# Patient Record
Sex: Male | Born: 1985
Health system: Southern US, Community
[De-identification: ages and names within clinical notes are randomized; demographics above are authoritative.]

## PROBLEM LIST (undated history)

## (undated) DIAGNOSIS — K122 Cellulitis and abscess of mouth: Secondary | ICD-10-CM

## (undated) HISTORY — PX: HAND SURGERY: SHX662

---

## 2006-12-16 ENCOUNTER — Other Ambulatory Visit: Payer: Self-pay

## 2006-12-16 ENCOUNTER — Ambulatory Visit: Payer: Self-pay | Admitting: Orthopaedic Surgery

## 2016-10-15 ENCOUNTER — Emergency Department (HOSPITAL_BASED_OUTPATIENT_CLINIC_OR_DEPARTMENT_OTHER)
Admission: EM | Admit: 2016-10-15 | Discharge: 2016-10-15 | Disposition: A | Discharge status: Home / Self Care | Payer: Self-pay | Attending: Emergency Medicine | Admitting: Emergency Medicine

## 2016-10-15 ENCOUNTER — Encounter (HOSPITAL_BASED_OUTPATIENT_CLINIC_OR_DEPARTMENT_OTHER): Payer: Self-pay | Admitting: *Deleted

## 2016-10-15 DIAGNOSIS — F17210 Nicotine dependence, cigarettes, uncomplicated: Secondary | ICD-10-CM | POA: Insufficient documentation

## 2016-10-15 DIAGNOSIS — K1379 Other lesions of oral mucosa: Secondary | ICD-10-CM | POA: Insufficient documentation

## 2016-10-15 LAB — RAPID STREP SCREEN (MED CTR MEBANE ONLY): Streptococcus, Group A Screen (Direct): NEGATIVE

## 2016-10-15 MED ORDER — METHYLPREDNISOLONE SODIUM SUCC 125 MG IJ SOLR
125.0000 mg | Freq: Once | INTRAMUSCULAR | Status: AC
  Administered 2016-10-15: 125 mg via INTRAVENOUS
  Filled 2016-10-15: qty 2

## 2016-10-15 MED ORDER — PREDNISONE 20 MG PO TABS: 60.0000 mg | ORAL_TABLET | Freq: Every day | ORAL | 0 refills | Status: DC

## 2016-10-15 MED ORDER — DIPHENHYDRAMINE HCL 50 MG/ML IJ SOLN
25.0000 mg | Freq: Once | INTRAMUSCULAR | Status: AC
  Administered 2016-10-15: 25 mg via INTRAVENOUS
  Filled 2016-10-15: qty 1

## 2016-10-15 MED FILL — predniSONE 20 MG TABS: 20 | 5 days supply | Qty: 15 | Fill #0

## 2016-10-15 NOTE — ED Notes (Signed)
Pt directed to pharmacy to pick up RX. Pt's wife present to drive

## 2016-10-15 NOTE — ED Provider Notes (Signed)
Please see previous physicians note regarding patient's presenting history and physical, initial ED course, and associated medical decision making.  Patient presents to ED with uvula swelling. On my re-evaluation, he is comfortably sleeping. Upon awakening, he reports swelling has gone down with steroids and benadryl. He is comfortable with discharge and outpatient treatments. He will continue benadryl and steroids as outpatient. Strict return and follow-up instructions reviewed. He expressed understanding of all discharge instructions and felt comfortable with the plan of care.    Lavera Guise, MD 10/15/16 [phone]

## 2016-10-15 NOTE — Discharge Instructions (Signed)
Take loratadine (Claritin) or cetirizine (Zyrtec) once a day. Take diphenhydramine (Benadryl) every four hours as needed. Return if the swelling feels like it is getting worse.

## 2016-10-15 NOTE — ED Notes (Signed)
Pt states did have cough yesterday  Pt having no diff talking

## 2016-10-15 NOTE — ED Triage Notes (Signed)
Pt states woke this am felt like his throat was swelling and having diff swallowing

## 2016-10-15 NOTE — ED Provider Notes (Signed)
[facility] Provider [facility]   CSN: [id number] Arrival date & time: 10/15/16  0524     History   Chief Complaint Chief Complaint  Patient presents with  . Oral Swelling    HPI George Huerta is a 31 y.o. male.  The history is provided by the patient.  He woke up this morning with a sense that something was in his throat was making it difficult for him to breathe and swallow. His wife looked in his throat and noticed that his uvula was swollen. He is not on any medication other than ranitidine. He denies any new exposures. Denies any rash. Symptoms have been stable since onset. He felt fine yesterday. This has never happened before. Nothing makes it better, nothing makes it worse.  History reviewed. No pertinent past medical history.  There are no active problems to display for this patient.   History reviewed. No pertinent surgical history.     Home Medications    Prior to Admission medications   Not on File    Family History History reviewed. No pertinent family history.  Social History Social History  Substance Use Topics  . Smoking status: Current Every Day Smoker  . Smokeless tobacco: Never Used  . Alcohol use No     Allergies   Patient has no known allergies.   Review of Systems Review of Systems  All other systems reviewed and are negative.    Physical Exam Updated Vital Signs BP 133/90 (BP Location: Right Arm)   Pulse 73   Temp 97.7 F (36.5 C) (Oral)   Resp 18   Ht 6' (1.829 m)   Wt 99.8 kg (220 lb)   SpO2 100%   BMI 29.84 kg/m   Physical Exam  Nursing note and vitals reviewed.  31 year old male, resting comfortably and in no acute distress. Vital signs are normal. Oxygen saturation is 100%, which is normal. Head is normocephalic and atraumatic. PERRLA, EOMI. there is mild edema of the uvula. There is no erythema and no exudate. No pooling of secretions. Phonation is normal. Neck is nontender and supple without  adenopathy or JVD. Back is nontender and there is no CVA tenderness. Lungs are clear without rales, wheezes, or rhonchi. Chest is nontender. Heart has regular rate and rhythm without murmur. Abdomen is soft, flat, nontender without masses or hepatosplenomegaly and peristalsis is normoactive. Extremities have no cyanosis or edema, full range of motion is present. Skin is warm and dry without rash. Neurologic: Mental status is normal, cranial nerves are intact, there are no motor or sensory deficits.  ED Treatments / Results  Labs (all labs ordered are listed, but only abnormal results are displayed) Labs Reviewed  RAPID STREP SCREEN (NOT AT [facility])  CULTURE, GROUP A STREP [facility])   Procedures Procedures (including critical care time)  Medications Ordered in ED Medications  diphenhydrAMINE (BENADRYL) injection 25 mg (25 mg Intravenous Given 10/15/16 0603)  methylPREDNISolone sodium succinate (SOLU-MEDROL) 125 mg/2 mL injection 125 mg (125 mg Intravenous Given 10/15/16 0603)     Initial Impression / Assessment and Plan / ED Course  I have reviewed the triage vital signs and the nursing notes.  Pertinent labs & imaging results that were available during my care of the patient were reviewed by me and considered in my medical decision making (see chart for details).  Swelling of uvula of uncertain cause. Probably allergic. He is given diphenhydramine and methylprednisolone and will be observed. Old records are reviewed, and he has  no relevant past visits.  7:06 AM Following above-noted treatment, he feels much better. However, it is only been about one hour since medication ministration. He will be observed in the ED for at least another hour. Anticipate discharge with prescription for prednisone and advised to take over-the-counter second-generation antihistamines.  Final Clinical Impressions(s) / ED Diagnoses   Final diagnoses:  Uvular swelling    New Prescriptions New  Prescriptions   PREDNISONE (DELTASONE) 20 MG TABLET    Take 3 tablets (60 mg total) by mouth daily.     Dione Booze, MD 10/15/16 [phone]

## 2016-10-17 LAB — CULTURE, GROUP A STREP (THRC)

## 2016-12-09 ENCOUNTER — Other Ambulatory Visit: Payer: Self-pay

## 2016-12-09 ENCOUNTER — Emergency Department (HOSPITAL_BASED_OUTPATIENT_CLINIC_OR_DEPARTMENT_OTHER)
Admission: EM | Admit: 2016-12-09 | Discharge: 2016-12-09 | Disposition: A | Discharge status: Home / Self Care | Payer: Self-pay | Attending: Emergency Medicine | Admitting: Emergency Medicine

## 2016-12-09 ENCOUNTER — Encounter (HOSPITAL_BASED_OUTPATIENT_CLINIC_OR_DEPARTMENT_OTHER): Payer: Self-pay

## 2016-12-09 DIAGNOSIS — F17210 Nicotine dependence, cigarettes, uncomplicated: Secondary | ICD-10-CM | POA: Insufficient documentation

## 2016-12-09 DIAGNOSIS — K122 Cellulitis and abscess of mouth: Secondary | ICD-10-CM | POA: Insufficient documentation

## 2016-12-09 MED ORDER — DIPHENHYDRAMINE HCL 50 MG/ML IJ SOLN
50.0000 mg | Freq: Once | INTRAMUSCULAR | Status: AC
  Administered 2016-12-09: 50 mg via INTRAVENOUS
  Filled 2016-12-09: qty 1

## 2016-12-09 MED ORDER — PREDNISONE 20 MG PO TABS: 60.0000 mg | ORAL_TABLET | Freq: Every day | ORAL | 0 refills | Status: DC

## 2016-12-09 MED ORDER — METHYLPREDNISOLONE SODIUM SUCC 125 MG IJ SOLR
125.0000 mg | Freq: Once | INTRAMUSCULAR | Status: AC
  Administered 2016-12-09: 125 mg via INTRAVENOUS
  Filled 2016-12-09: qty 2

## 2016-12-09 NOTE — ED Notes (Signed)
Pt sleeping with resp easy and reg, arouses to spoken name, informed that his wife is out front to pick him up and drive him home.

## 2016-12-09 NOTE — ED Notes (Signed)
ED Provider at bedside. 

## 2016-12-09 NOTE — ED Triage Notes (Addendum)
Pt c/o uvula swelling and difficulty breathing. Pt seen for same last month, reports the steroids helped. Pt ambulated to room with steady gate and in NAD. Pt able to speak in complete sentences and no difficulty controlling secretions.

## 2016-12-09 NOTE — Discharge Instructions (Addendum)
You may continue Benadryl 50 mg every 8 hours for the next several days.  You may start your steroids tomorrow morning November 30.   To find a primary care or specialty doctor please call [phone] or [phone] to access "[facility] Find a Doctor Service."  You may also go on the [facility] website at [email or web address]  There are also multiple [facility], [facility], [facility] and [facility] practices throughout [place] that are frequently accepting new patients. You may find a clinic that is close to your home and contact them.  [facility] -  [address] [place] [id number] [phone]   [facility] -  [address] [place] [id number] [phone]   [facility] [phone]  [place] [phone]

## 2016-12-09 NOTE — ED Provider Notes (Signed)
TIME SEEN: 5:39 AM  CHIEF COMPLAINT: difficulty breathing and swallowing  HPI: Patient is a 31 year old male with no significant past medical history who presents to the emergency department with feeling like his uvula was swollen.  States he went to bed last night and was feeling fine and woke up this morning to go to work and felt like he could not swallow and breathe normally.  He has had uvulitis once before approximately 2 months ago.  Was given IV Benadryl and IV steroids and symptoms improved significantly.  Denies any lip or tongue swelling.  Denies any recent fevers, cough, sore throat.  Denies any new exposures.  No rash.  ROS: See HPI Constitutional: no fever  Eyes: no drainage  ENT: no runny nose   Cardiovascular:  no chest pain  Resp: no SOB  GI: no vomiting GU: no dysuria Integumentary: no rash  Allergy: no hives  Musculoskeletal: no leg swelling  Neurological: no slurred speech ROS otherwise negative  PAST MEDICAL HISTORY/PAST SURGICAL HISTORY:  No past medical history on file.  MEDICATIONS:  Prior to Admission medications   Medication Sig Start Date End Date Taking? Authorizing Provider  predniSONE (DELTASONE) 20 MG tablet Take 3 tablets (60 mg total) by mouth daily. 10/15/16  Yes Dione BoozeGlick, David, MD    ALLERGIES:  No Known Allergies  SOCIAL HISTORY:  Social History   Tobacco Use  . Smoking status: Current Every Day Smoker  . Smokeless tobacco: Never Used  Substance Use Topics  . Alcohol use: No    FAMILY HISTORY: No family history on file.  EXAM: BP 130/76 (BP Location: Right Arm)   Pulse 80   Temp 98.4 F (36.9 C) (Oral)   Resp 20   Ht 6' (1.829 m)   Wt 95.7 kg (211 lb)   SpO2 100%   BMI 28.62 kg/m  CONSTITUTIONAL: Alert and oriented and responds appropriately to questions. Well-appearing; well-nourished HEAD: Normocephalic EYES: Conjunctivae clear, pupils appear equal, EOMI ENT: normal nose; moist mucous membranes; No pharyngeal erythema or  petechiae, no tonsillar hypertrophy or exudate, no uvular deviation, uvula is mildly swollen but not erythematous and no associated lesions, no unilateral swelling, no trismus or drooling, no muffled voice, normal phonation, no stridor, no dental caries present, no drainable dental abscess noted, no Ludwig's angina, tongue sits flat in the bottom of the mouth, no angioedema, no facial erythema or warmth, no facial swelling; no pain with movement of the neck. NECK: Supple, no meningismus, no nuchal rigidity, no LAD  CARD: RRR; S1 and S2 appreciated; no murmurs, no clicks, no rubs, no gallops RESP: Normal chest excursion without splinting or tachypnea; breath sounds clear and equal bilaterally; no wheezes, no rhonchi, no rales, no hypoxia or respiratory distress, speaking full sentences ABD/GI: Normal bowel sounds; non-distended; soft, non-tender, no rebound, no guarding, no peritoneal signs, no hepatosplenomegaly BACK:  The back appears normal and is non-tender to palpation, there is no CVA tenderness EXT: Normal ROM in all joints; non-tender to palpation; no edema; normal capillary refill; no cyanosis, no calf tenderness or swelling    SKIN: Normal color for age and race; warm; no rash NEURO: Moves all extremities equally PSYCH: The patient's mood and manner are appropriate. Grooming and personal hygiene are appropriate.  MEDICAL DECISION MAKING: Patient here with a uvulitis.  Unclear etiology.  This is his second time with similar symptoms.  Reports IV steroids and Benadryl helped him significantly before.  Will give IV Benadryl and Solu-Medrol here today and monitor  patient.  He has no distress.  No trismus or drooling.  Normal phonation.  No stridor.  No other sign of allergic reaction.  No angioedema present.  His wife will be able to take him home.  ED PROGRESS: 7:00 AM  Pt's symptoms have improved.  He is resting comfortably.  No distress.  His wife will come pick him up.  Will discharge with  steroid prescription and have him continue Benadryl at home.  Patient comfortable with this plan.  Have given him allergy follow-up as this may be more allergic reaction rather than infectious.   At this time, I do not feel there is any life-threatening condition present. I have reviewed and discussed all results (EKG, imaging, lab, urine as appropriate) and exam findings with patient/family. I have reviewed nursing notes and appropriate previous records.  I feel the patient is safe to be discharged home without further emergent workup and can continue workup as an outpatient as needed. Discussed usual and customary return precautions. Patient/family verbalize understanding and are comfortable with this plan.  Outpatient follow-up has been provided if needed. All questions have been answered.      Usher Hedberg, Layla MawKristen N, DO 12/09/16 [phone]

## 2017-03-31 ENCOUNTER — Encounter (HOSPITAL_BASED_OUTPATIENT_CLINIC_OR_DEPARTMENT_OTHER): Payer: Self-pay | Admitting: Emergency Medicine

## 2017-03-31 ENCOUNTER — Emergency Department (HOSPITAL_BASED_OUTPATIENT_CLINIC_OR_DEPARTMENT_OTHER)
Admission: EM | Admit: 2017-03-31 | Discharge: 2017-03-31 | Disposition: A | Discharge status: Home / Self Care | Payer: Self-pay | Attending: Emergency Medicine | Admitting: Emergency Medicine

## 2017-03-31 ENCOUNTER — Other Ambulatory Visit: Payer: Self-pay

## 2017-03-31 DIAGNOSIS — K1379 Other lesions of oral mucosa: Secondary | ICD-10-CM | POA: Insufficient documentation

## 2017-03-31 DIAGNOSIS — F17210 Nicotine dependence, cigarettes, uncomplicated: Secondary | ICD-10-CM | POA: Insufficient documentation

## 2017-03-31 HISTORY — DX: Cellulitis and abscess of mouth: K12.2

## 2017-03-31 MED ORDER — FAMOTIDINE 20 MG PO TABS
40.0000 mg | ORAL_TABLET | Freq: Once | ORAL | Status: AC
  Administered 2017-03-31: 40 mg via ORAL
  Filled 2017-03-31: qty 2

## 2017-03-31 MED ORDER — DIPHENHYDRAMINE HCL 25 MG PO CAPS
50.0000 mg | ORAL_CAPSULE | Freq: Once | ORAL | Status: AC
  Administered 2017-03-31: 50 mg via ORAL
  Filled 2017-03-31: qty 2

## 2017-03-31 MED ORDER — METHYLPREDNISOLONE SODIUM SUCC 125 MG IJ SOLR
125.0000 mg | Freq: Once | INTRAMUSCULAR | Status: AC
  Administered 2017-03-31: 125 mg via INTRAMUSCULAR
  Filled 2017-03-31: qty 2

## 2017-03-31 NOTE — ED Provider Notes (Signed)
   [facility] Provider Note: Lowella DellJ. Lane Brittane Grudzinski, MD, FACEP  CSN: [id number] MRN: [id number] ARRIVAL: 03/31/17 at 0452 ROOM: MH05/MH05   CHIEF COMPLAINT  Oral Swelling   HISTORY OF PRESENT ILLNESS  03/31/17 5:02 AM George Huerta is a 32 y.o. male with a history of uvular edema in the past.  These episodes usually resolve readily after Solu-Medrol and Benadryl.  He awoke this morning with swelling in his throat consistent with previous uvular edema.  His symptoms are not as severe as they have been in the past.  There is minimal associated discomfort.  He is having no difficulty breathing, swallowing or speaking.  He denies other symptoms such as itching, tongue swelling or lip swelling. He does not know what may have triggered this.  He denies drug use.  He is not on any regular medications.   Past Medical History:  Diagnosis Date  . Uvulitis     History reviewed. No pertinent surgical history.  No family history on file.  Social History   Tobacco Use  . Smoking status: Current Every Day Smoker  . Smokeless tobacco: Never Used  Substance Use Topics  . Alcohol use: No  . Drug use: No    Prior to Admission medications   Not on File    Allergies Patient has no known allergies.   REVIEW OF SYSTEMS  Negative except as noted here or in the History of Present Illness.   PHYSICAL EXAMINATION  Initial Vital Signs Blood pressure 122/84, pulse 71, temperature 98.2 F (36.8 C), temperature source Oral, resp. rate 16, height 6\' 1"  (1.854 m), weight 95.3 kg (210 lb), SpO2 100 %.  Examination General: Well-developed, well-nourished male in no acute distress; appearance consistent with age of record HENT: normocephalic; atraumatic; no dysphonia; no stridor; uvular edema, midline; no trismus Eyes: pupils equal, round and reactive to light; extraocular muscles intact Neck: supple; no lymphadenopathy Heart: regular rate and rhythm Lungs: clear to auscultation  bilaterally Abdomen: soft; nondistended; nontender; bowel sounds present Extremities: No deformity; full range of motion; pulses normal Neurologic: Awake, alert and oriented; motor function intact in all extremities and symmetric; no facial droop Skin: Warm and dry Psychiatric: Normal mood and affect   RESULTS  Summary of this visit's results, reviewed by myself:   EKG Interpretation  Date/Time:    Ventricular Rate:    PR Interval:    QRS Duration:   QT Interval:    QTC Calculation:   R Axis:     Text Interpretation:        Laboratory Studies: No results found for this or any previous visit (from the past 24 hour(s)). Imaging Studies: No results found.  ED COURSE  Nursing notes and initial vitals signs, including pulse oximetry, reviewed.  Vitals:   03/31/17 0456 03/31/17 0457  BP: 122/84   Pulse: 71   Resp: 16   Temp: 98.2 F (36.8 C)   TempSrc: Oral   SpO2: 100%   Weight:  95.3 kg (210 lb)  Height:  6\' 1"  (1.854 m)   Patient is responded well to Benadryl and Solu-Medrol in the past.  We will repeat these and advised him to use over-the-counter Benadryl as needed until symptoms fully resolve.  PROCEDURES    ED DIAGNOSES     ICD-10-CM   1. Uvular edema K13.79        Paula LibraMolpus, Erendida Wrenn, MD 03/31/17 [phone]

## 2017-03-31 NOTE — ED Triage Notes (Signed)
Pt states he has swelling in his throat. Pt has hx of uvulitis.

## 2017-08-10 ENCOUNTER — Emergency Department (HOSPITAL_BASED_OUTPATIENT_CLINIC_OR_DEPARTMENT_OTHER)
Admission: EM | Admit: 2017-08-10 | Discharge: 2017-08-10 | Disposition: A | Discharge status: Home / Self Care | Payer: Self-pay | Attending: Emergency Medicine | Admitting: Emergency Medicine

## 2017-08-10 ENCOUNTER — Other Ambulatory Visit: Payer: Self-pay

## 2017-08-10 ENCOUNTER — Encounter (HOSPITAL_BASED_OUTPATIENT_CLINIC_OR_DEPARTMENT_OTHER): Payer: Self-pay | Admitting: Emergency Medicine

## 2017-08-10 ENCOUNTER — Emergency Department (HOSPITAL_BASED_OUTPATIENT_CLINIC_OR_DEPARTMENT_OTHER): Payer: Self-pay

## 2017-08-10 DIAGNOSIS — F17210 Nicotine dependence, cigarettes, uncomplicated: Secondary | ICD-10-CM | POA: Insufficient documentation

## 2017-08-10 DIAGNOSIS — R0789 Other chest pain: Secondary | ICD-10-CM | POA: Insufficient documentation

## 2017-08-10 LAB — TROPONIN I

## 2017-08-10 MED ORDER — KETOROLAC TROMETHAMINE 30 MG/ML IJ SOLN
30.0000 mg | Freq: Once | INTRAMUSCULAR | Status: AC
  Administered 2017-08-10: 30 mg via INTRAMUSCULAR
  Filled 2017-08-10: qty 1

## 2017-08-10 NOTE — ED Notes (Signed)
Pt understood dc material. NAD noted. 

## 2017-08-10 NOTE — ED Provider Notes (Signed)
[facility] EMERGENCY DEPARTMENT Provider Note   CSN: [id number] Arrival date & time: 08/10/17  0218     History   Chief Complaint Chief Complaint  Patient presents with  . Chest Pain    HPI George Huerta is a 32 y.o. male.  HPI  This is a 32 year old male who presents with chest pain.  Patient reports onset of chest pain this evening.  He states that he did not feel well tonight went to [place].  His blood pressure was 133/83.  States the pain is in the right side of his chest.  He has had similar pains on and off for the last several weeks.  Reports increased stress at work.  Pain is described as pressure.  It is 4 out of 10.  He has not taken anything for his pain.  It is not exertional.  Not affected by food intake.  Denies fevers, shortness of breath, cough.  No history of blood clots, leg swelling, recent long travel.  Denies history of hypertension, hyperlipidemia, diabetes.  Past Medical History:  Diagnosis Date  . Uvulitis     There are no active problems to display for this patient.   Past Surgical History:  Procedure Laterality Date  . HAND SURGERY          Home Medications    Prior to Admission medications   Not on File    Family History History reviewed. No pertinent family history.  Social History Social History   Tobacco Use  . Smoking status: Current Every Day Smoker    Packs/day: 1.00    Types: Cigarettes  . Smokeless tobacco: Never Used  Substance Use Topics  . Alcohol use: Yes    Comment: social  . Drug use: No     Allergies   Patient has no known allergies.   Review of Systems Review of Systems  Constitutional: Negative for fever.  Respiratory: Negative for cough and shortness of breath.   Cardiovascular: Positive for chest pain. Negative for leg swelling.  Gastrointestinal: Negative for abdominal pain, nausea and vomiting.  Genitourinary: Negative for dysuria.  Neurological: Negative for light-headedness.    All other systems reviewed and are negative.    Physical Exam Updated Vital Signs BP 125/73 (BP Location: Left Arm)   Pulse 60   Temp 98.3 F (36.8 C) (Oral)   Resp 16   Ht 6\' 1"  (1.854 m)   Wt 93.9 kg (207 lb)   SpO2 100%   BMI 27.31 kg/m   Physical Exam  Constitutional: He is oriented to person, place, and time. He appears well-developed and well-nourished. No distress.  HENT:  Head: Normocephalic and atraumatic.  Eyes: Pupils are equal, round, and reactive to light.  Cardiovascular: Normal rate, regular rhythm, normal heart sounds and normal pulses.  No murmur heard. Pulmonary/Chest: Effort normal and breath sounds normal. No respiratory distress. He has no wheezes.  Abdominal: Soft. Bowel sounds are normal. There is no tenderness. There is no rebound.  Musculoskeletal: He exhibits no edema.       Right lower leg: He exhibits no tenderness and no edema.       Left lower leg: He exhibits no tenderness and no edema.  Lymphadenopathy:    He has no cervical adenopathy.  Neurological: He is alert and oriented to person, place, and time.  Skin: Skin is warm and dry.  Psychiatric: He has a normal mood and affect.  Nursing note and vitals reviewed.    ED Treatments /  Results  Labs (all labs ordered are listed, but only abnormal results are displayed) Labs Reviewed  TROPONIN I    EKG EKG Interpretation  Date/Time:  Wednesday August 10 2017 02:34:55 EDT Ventricular Rate:  58 PR Interval:    QRS Duration: 95 QT Interval:  388 QTC Calculation: 381 R Axis:   72 Text Interpretation:  Sinus rhythm Confirmed by Ross MarcusHorton, Bearl Talarico ([id number]) on 08/10/2017 3:00:46 AM   Radiology Dg Chest 2 View  Result Date: 08/10/2017 CLINICAL DATA:  Initial evaluation for acute chest pain. EXAM: CHEST - 2 VIEW COMPARISON:  Prior radiograph from 12/16/2006. FINDINGS: The cardiac and mediastinal silhouettes are stable in size and contour, and remain within normal limits. The lungs are normally  inflated. No airspace consolidation, pleural effusion, or pulmonary edema is identified. There is no pneumothorax. No acute osseous abnormality identified. IMPRESSION: No active cardiopulmonary disease. Electronically Signed   By: Rise MuBenjamin  McClintock M.D.   On: 08/10/2017 03:39    Procedures Procedures (including critical care time)  Medications Ordered in ED Medications  ketorolac (TORADOL) 30 MG/ML injection 30 mg (30 mg Intramuscular Given 08/10/17 0315)     Initial Impression / Assessment and Plan / ED Course  I have reviewed the triage vital signs and the nursing notes.  Pertinent labs & imaging results that were available during my care of the patient were reviewed by me and considered in my medical decision making (see chart for details).     Patient presents with chest pain.  Tylenol for last several weeks.  He is overall nontoxic-appearing vital signs are reassuring.  EKG shows no evidence of ischemia.  Only risk factor is smoking.  Troponin is negative.  Doubt ACS.  He is low risk for PE and is PERC negative.  Chest x-ray shows no evidence of pneumothorax or pneumonia.  Patient much improved after Toradol.  Suspect musculoskeletal etiology versus costochondritis.  Recommend naproxen twice daily.  After history, exam, and medical workup I feel the patient has been appropriately medically screened and is safe for discharge home. Pertinent diagnoses were discussed with the patient. Patient was given return precautions.   Final Clinical Impressions(s) / ED Diagnoses   Final diagnoses:  Atypical chest pain    ED Discharge Orders    None       Alexxa Sabet, Mayer Maskerourtney F, MD 08/10/17 0400

## 2017-08-10 NOTE — ED Triage Notes (Signed)
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

## 2017-08-10 NOTE — ED Notes (Signed)
Patient transported to X-ray 

## 2017-08-10 NOTE — Discharge Instructions (Addendum)
You were seen today for chest pain.  Your work-up is reassuring.  This does not appear to be related to your heart and your chest x-ray looks reassuring.  Take naproxen as needed for pain.  Follow-up with your primary physician.

## 2017-08-10 NOTE — ED Notes (Signed)
ED Provider at bedside. 

## 2018-06-13 ENCOUNTER — Other Ambulatory Visit: Payer: Self-pay

## 2018-06-13 ENCOUNTER — Emergency Department (HOSPITAL_BASED_OUTPATIENT_CLINIC_OR_DEPARTMENT_OTHER)
Admission: EM | Admit: 2018-06-13 | Discharge: 2018-06-13 | Disposition: A | Discharge status: Home / Self Care | Payer: HRSA Program | Attending: Emergency Medicine | Admitting: Emergency Medicine

## 2018-06-13 ENCOUNTER — Encounter (HOSPITAL_BASED_OUTPATIENT_CLINIC_OR_DEPARTMENT_OTHER): Payer: Self-pay | Admitting: *Deleted

## 2018-06-13 DIAGNOSIS — F17210 Nicotine dependence, cigarettes, uncomplicated: Secondary | ICD-10-CM | POA: Diagnosis not present

## 2018-06-13 DIAGNOSIS — R509 Fever, unspecified: Secondary | ICD-10-CM | POA: Diagnosis present

## 2018-06-13 DIAGNOSIS — Z20828 Contact with and (suspected) exposure to other viral communicable diseases: Secondary | ICD-10-CM | POA: Diagnosis not present

## 2018-06-13 DIAGNOSIS — B349 Viral infection, unspecified: Secondary | ICD-10-CM | POA: Diagnosis not present

## 2018-06-13 DIAGNOSIS — Z20822 Contact with and (suspected) exposure to covid-19: Secondary | ICD-10-CM

## 2018-06-13 LAB — URINALYSIS, ROUTINE W REFLEX MICROSCOPIC
Bilirubin Urine: NEGATIVE
Glucose, UA: NEGATIVE mg/dL
Hgb urine dipstick: NEGATIVE
Ketones, ur: NEGATIVE mg/dL
Leukocytes,Ua: NEGATIVE
Nitrite: NEGATIVE
Protein, ur: NEGATIVE mg/dL
Specific Gravity, Urine: 1.015 (ref 1.005–1.030)
pH: 8.5 — ABNORMAL HIGH (ref 5.0–8.0)

## 2018-06-13 NOTE — ED Notes (Signed)
Given soda 

## 2018-06-13 NOTE — ED Notes (Signed)
ED Provider at bedside. 

## 2018-06-13 NOTE — Discharge Instructions (Addendum)
° ° °  Person Under Monitoring Name: George Huerta  Location: [address] [place] [address]   CORONAVIRUS DISEASE 2019 (COVID-19) Guidance for Persons Under Investigation You are being tested for the virus that causes coronavirus disease 2019 (COVID-19). Public health actions are necessary to ensure protection of your health and the health of others, and to prevent further spread of infection. COVID-19 is caused by a virus that can cause symptoms, such as fever, cough, and shortness of breath. The primary transmission from person to person is by coughing or sneezing. On February 09, 2018, the World Health Organization announced a [employer] Emergency of International Concern and on February 10, 2018 the [place]. Department of Health and Human Services declared a public health emergency. If the virus that causesCOVID-19 spreads in the community, it could have severe public health consequences.  As a person under investigation for COVID-19, the [employer] of Health and [employer] Division of [employer] advises you to adhere to the following guidance until your test results are reported to you. If your test result is positive, you will receive additional information from your provider and your local health department at that time.  Remain at home until you are cleared by your health provider or public health authorities.  Keep a log of visitors to your home using the form provided. Any visitors to your home must be aware of your isolation status. If you plan to move to a new address or leave the county, notify the local health department in your county. Call a doctor or seek care if you have an urgent medical need. Before seeking medical care, call ahead and get instructions from the provider before arriving at the medical office, clinic or hospital. Notify them that you are being tested for the virus that causes COVID-19 so arrangements can be made, as necessary, to prevent  transmission to others in the healthcare setting. Next, notify the local health department in your county. If a medical emergency arises and you need to call 911, inform the first responders that you are being tested for the virus that causes COVID-19. Next, notify the local health department in your county. Adhere to all guidance set forth by the [facility] of [employer] for [facility] of patients that is based on guidance from the Center for Disease Control and Prevention with suspected or confirmed COVID-19. It is provided with this guidance for Persons Under Investigation.  Your health and the health of our community are our top priorities. Public Health officials remain available to provide assistance and counseling to you about COVID-19 and compliance with this guidance.  Provider: ____________________________________________________________ Date: ______/_____/_________  By signing below, you acknowledge that you have read and agree to comply with this Guidance for Persons Under Investigation. ______________________________________________________________ Date: ______/_____/_________  WHO DO I CALL? You can find a list of local health departments here: [email or web address] Health Department: ____________________________________________________________________ Contact Name: ________________________________________________________________________ Telephone: ___________________________________________________________________________  Nedra Hai, Division of Public Health, Communicable Disease Branch COVID-19 Guidance for Persons Under Investigation March 18, 2018

## 2018-06-13 NOTE — ED Triage Notes (Signed)
He had a low grade fever today. Cough x 3 weeks.

## 2018-06-13 NOTE — ED Notes (Signed)
Asked pt for a urine specimen  Pt states he cannot provide one right now

## 2018-06-13 NOTE — ED Provider Notes (Signed)
[facility] EMERGENCY DEPARTMENT Provider Note   CSN: [id number] Arrival date & time: 06/13/18  2037    History   Chief Complaint Chief Complaint  Patient presents with  . Fever    HPI George Huerta is a 33 y.o. male.     HPI  33 year old male comes in a chief complaint of fever.  Patient reports that when he went to work today he was screened for fever and a low-grade temperature was noted.  Upon further thinking he has noted that he has been having some weakness and shortness of breath and cough, symptoms he has been attributing to his smoking.  His wife works as a Engineer, civil (consulting) and he has a young child at home therefore he wanted to ensure he did not have COVID-19.  Past Medical History:  Diagnosis Date  . Uvulitis     There are no active problems to display for this patient.   Past Surgical History:  Procedure Laterality Date  . HAND SURGERY          Home Medications    Prior to Admission medications   Not on File    Family History No family history on file.  Social History Social History   Tobacco Use  . Smoking status: Current Every Day Smoker    Packs/day: 1.00    Types: Cigarettes  . Smokeless tobacco: Never Used  Substance Use Topics  . Alcohol use: Yes    Comment: social  . Drug use: No     Allergies   Patient has no known allergies.   Review of Systems Review of Systems  Constitutional: Positive for activity change, fatigue and fever.  Respiratory: Positive for cough and shortness of breath.   Gastrointestinal: Negative for vomiting.  Allergic/Immunologic: Negative for immunocompromised state.     Physical Exam Updated Vital Signs BP 116/73 (BP Location: Right Arm)   Pulse 72   Temp 98.5 F (36.9 C) (Oral)   Resp 20   Ht 6\' 1"  (1.854 m)   Wt 98 kg   SpO2 99%   BMI 28.51 kg/m   Physical Exam Vitals signs and nursing note reviewed.  Constitutional:      Appearance: He is well-developed.  HENT:     Head:  Atraumatic.  Neck:     Musculoskeletal: Neck supple.  Cardiovascular:     Rate and Rhythm: Normal rate.  Pulmonary:     Effort: Pulmonary effort is normal.  Skin:    General: Skin is warm.  Neurological:     Mental Status: He is alert and oriented to person, place, and time.      ED Treatments / Results  Labs (all labs ordered are listed, but only abnormal results are displayed) Labs Reviewed  URINALYSIS, ROUTINE W REFLEX MICROSCOPIC - Abnormal; Notable for the following components:      Result Value   pH 8.5 (*)    All other components within normal limits  SARS CORONAVIRUS 2 (HOSPITAL ORDER, PERFORMED IN [facility])    EKG None  Radiology No results found.  Procedures Procedures (including critical care time)  Medications Ordered in ED Medications - No data to display   Initial Impression / Assessment and Plan / ED Course  I have reviewed the triage vital signs and the nursing notes.  Pertinent labs & imaging results that were available during my care of the patient were reviewed by me and considered in my medical decision making (see chart for details).  George Huerta was evaluated in Emergency Department on 06/13/2018 for the symptoms described in the history of present illness. He was evaluated in the context of the global COVID-19 pandemic, which necessitated consideration that the patient might be at risk for infection with the SARS-CoV-2 virus that causes COVID-19. Institutional protocols and algorithms that pertain to the evaluation of patients at risk for COVID-19 are in a state of rapid change based on information released by regulatory bodies including the CDC and federal and state organizations. These policies and algorithms were followed during the patient's care in the ED.   Patient comes in a chief complaint of fever, cough, shortness of breath.  His symptoms are mild.  He is not noted to be febrile here and no clear evidence of  infection appreciated.  With his weak symptoms of shortness of breath and dry cough, and a wife that works in healthcare setting we will get an outpatient cover test for him.  Smoking cessation instruction/counseling given:  counseled patient on the dangers of tobacco use, advised patient to stop smoking, and reviewed strategies to maximize success/ 5 min   Final Clinical Impressions(s) / ED Diagnoses   Final diagnoses:  Viral syndrome  Suspected 2019 Novel Coronavirus Infection    ED Discharge Orders    None       Derwood KaplanNanavati, Leenah Seidner, MD 06/13/18 2243

## 2018-06-14 LAB — SARS CORONAVIRUS 2 BY RT PCR (HOSPITAL ORDER, PERFORMED IN ~~LOC~~ HOSPITAL LAB): SARS Coronavirus 2: NEGATIVE

## 2019-03-17 ENCOUNTER — Inpatient Hospital Stay: Admit: 2019-03-17 | Discharge: 2019-03-17 | Payer: PRIVATE HEALTH INSURANCE | Primary: Internal Medicine

## 2019-03-17 DIAGNOSIS — Z20828 Contact with and (suspected) exposure to other viral communicable diseases: Secondary | ICD-10-CM

## 2019-03-17 DIAGNOSIS — Z20822 Contact with and (suspected) exposure to covid-19: Secondary | ICD-10-CM

## 2019-03-17 LAB — COVID-19 CLEARANCE OR FOR PLACEMENT ONLY: BKR SARS-COV-2 RNA (COVID-19) (YH): NEGATIVE

## 2019-05-11 ENCOUNTER — Inpatient Hospital Stay: Admit: 2019-05-11 | Discharge: 2019-05-11 | Payer: PRIVATE HEALTH INSURANCE | Primary: Internal Medicine

## 2019-05-11 DIAGNOSIS — Z20828 Contact with and (suspected) exposure to other viral communicable diseases: Secondary | ICD-10-CM

## 2019-05-12 DIAGNOSIS — Z20822 Contact with and (suspected) exposure to covid-19: Secondary | ICD-10-CM

## 2019-05-13 LAB — SARS COV-2 (COVID-19) RNA: BKR SARS-COV-2 RNA (COVID-19) (YH): NEGATIVE

## 2019-05-21 DIAGNOSIS — R3121 Asymptomatic microscopic hematuria: Secondary | ICD-10-CM | POA: Insufficient documentation

## 2019-05-21 DIAGNOSIS — F411 Generalized anxiety disorder: Secondary | ICD-10-CM | POA: Insufficient documentation

## 2019-05-21 DIAGNOSIS — F332 Major depressive disorder, recurrent severe without psychotic features: Secondary | ICD-10-CM | POA: Insufficient documentation

## 2019-05-21 DIAGNOSIS — F17200 Nicotine dependence, unspecified, uncomplicated: Secondary | ICD-10-CM | POA: Insufficient documentation

## 2019-05-21 DIAGNOSIS — R7303 Prediabetes: Secondary | ICD-10-CM | POA: Insufficient documentation

## 2020-01-10 ENCOUNTER — Emergency Department (HOSPITAL_BASED_OUTPATIENT_CLINIC_OR_DEPARTMENT_OTHER)
Admission: EM | Admit: 2020-01-10 | Discharge: 2020-01-10 | Discharge status: Against Medical Advice | Payer: BC Managed Care – PPO

## 2020-01-10 ENCOUNTER — Other Ambulatory Visit: Payer: Self-pay

## 2020-01-11 ENCOUNTER — Other Ambulatory Visit: Payer: Self-pay

## 2020-01-11 ENCOUNTER — Encounter (HOSPITAL_BASED_OUTPATIENT_CLINIC_OR_DEPARTMENT_OTHER): Payer: Self-pay | Admitting: Emergency Medicine

## 2020-01-11 ENCOUNTER — Emergency Department (HOSPITAL_BASED_OUTPATIENT_CLINIC_OR_DEPARTMENT_OTHER)
Admission: EM | Admit: 2020-01-11 | Discharge: 2020-01-11 | Disposition: A | Discharge status: Home / Self Care | Payer: BC Managed Care – PPO | Attending: Emergency Medicine | Admitting: Emergency Medicine

## 2020-01-11 DIAGNOSIS — F17210 Nicotine dependence, cigarettes, uncomplicated: Secondary | ICD-10-CM | POA: Insufficient documentation

## 2020-01-11 DIAGNOSIS — A6001 Herpesviral infection of penis: Secondary | ICD-10-CM | POA: Diagnosis not present

## 2020-01-11 DIAGNOSIS — L299 Pruritus, unspecified: Secondary | ICD-10-CM | POA: Diagnosis present

## 2020-01-11 MED ORDER — VALACYCLOVIR HCL 500 MG PO TABS
1000.0000 mg | ORAL_TABLET | Freq: Once | ORAL | Status: AC
  Administered 2020-01-11: 1000 mg via ORAL
  Filled 2020-01-11: qty 2

## 2020-01-11 MED ORDER — VALACYCLOVIR HCL 1 G PO TABS: 1000.0000 mg | ORAL_TABLET | Freq: Two times a day (BID) | ORAL | 0 refills | Status: AC

## 2020-01-11 NOTE — ED Notes (Signed)
Chaperoned Dr. Read Drivers for exam. Penile swabs obtained and sent. Pt tolerated well.

## 2020-01-11 NOTE — ED Triage Notes (Signed)
Pt reports itching of foreskin this morning, reports he itched area and is now having a "break out"

## 2020-01-11 NOTE — ED Provider Notes (Signed)
   [facility] Provider Note: George Dell, MD, FACEP  CSN: [id number] MRN: [id number] ARRIVAL: 01/11/20 at 0122 ROOM: MH02/MH02   CHIEF COMPLAINT  Rash   HISTORY OF PRESENT ILLNESS  01/11/20 6:24 AM George Huerta is a 34 y.o. male who noticed itching on the shaft of his penis yesterday.  Later in the day what he looked at his penis he saw a patch of vesicular lesions on the dorsal skin.  They are not painful.  The vesicles have not yet broken down.  He is not having any urethral discharge.   Past Medical History:  Diagnosis Date  . Uvulitis     Past Surgical History:  Procedure Laterality Date  . HAND SURGERY      History reviewed. No pertinent family history.  Social History   Tobacco Use  . Smoking status: Current Every Day Smoker    Packs/day: 1.00    Types: Cigarettes  . Smokeless tobacco: Never Used  Substance Use Topics  . Alcohol use: Yes    Comment: social  . Drug use: No    Prior to Admission medications   Medication Sig Start Date End Date Taking? Authorizing Provider  valACYclovir (VALTREX) 1000 MG tablet Take 1 tablet (1,000 mg total) by mouth 2 (two) times daily for 10 days. 01/11/20 01/21/20 Yes Racheal Mathurin, MD    Allergies Patient has no known allergies.   REVIEW OF SYSTEMS  Negative except as noted here or in the History of Present Illness.   PHYSICAL EXAMINATION  Initial Vital Signs Blood pressure 134/89, pulse 91, temperature 98.3 F (36.8 C), resp. rate 16, weight 97.1 kg, SpO2 97 %.  Examination General: Well-developed, well-nourished male in no acute distress; appearance consistent with age of record HENT: normocephalic; atraumatic Eyes: Normal appearance Neck: supple Heart: regular rate and rhythm Lungs: clear to auscultation bilaterally Abdomen: soft; nondistended; nontender; bowel sounds present GU: Tanner V male, circumcised; no urethral discharge; vesicular rash to shaft of penis Extremities: No deformity;  full range of motion Neurologic: Awake, alert and oriented; motor function intact in all extremities and symmetric; no facial droop Skin: Warm and dry Psychiatric: Normal mood and affect   RESULTS  Summary of this visit's results, reviewed and interpreted by myself:   EKG Interpretation  Date/Time:    Ventricular Rate:    PR Interval:    QRS Duration:   QT Interval:    QTC Calculation:   R Axis:     Text Interpretation:        Laboratory Studies: No results found for this or any previous visit (from the past 24 hour(s)). Imaging Studies: No results found.  ED COURSE and MDM  Nursing notes, initial and subsequent vitals signs, including pulse oximetry, reviewed and interpreted by myself.  Vitals:   01/11/20 0128 01/11/20 0131 01/11/20 0603  BP:  136/88 134/89  Pulse:  89 91  Resp:  16 16  Temp:  98.3 F (36.8 C)   SpO2:  96% 97%  Weight: 97.1 kg     Medications  valACYclovir (VALTREX) tablet 1,000 mg (has no administration in time range)    Presentation pathognomonic for genital herpes.  Vesicular lesions were cultured for HSV culture and typing.  Will start on Valtrex.  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

## 2020-01-11 NOTE — ED Notes (Signed)
Discharge instructions and medication discussed with patient. Verbalized understanding. Departs ED at this time in stable condition.

## 2020-01-14 LAB — GC/CHLAMYDIA PROBE AMP (~~LOC~~) NOT AT ARMC
Chlamydia: NEGATIVE
Comment: NEGATIVE
Comment: NORMAL
Neisseria Gonorrhea: NEGATIVE

## 2020-01-14 LAB — HSV CULTURE AND TYPING

## 2020-03-11 ENCOUNTER — Other Ambulatory Visit: Payer: Self-pay

## 2020-03-11 ENCOUNTER — Encounter (HOSPITAL_BASED_OUTPATIENT_CLINIC_OR_DEPARTMENT_OTHER): Payer: Self-pay | Admitting: Emergency Medicine

## 2020-03-11 ENCOUNTER — Emergency Department (HOSPITAL_BASED_OUTPATIENT_CLINIC_OR_DEPARTMENT_OTHER): Payer: BC Managed Care – PPO

## 2020-03-11 ENCOUNTER — Emergency Department (HOSPITAL_BASED_OUTPATIENT_CLINIC_OR_DEPARTMENT_OTHER)
Admission: EM | Admit: 2020-03-11 | Discharge: 2020-03-11 | Disposition: A | Discharge status: Home / Self Care | Payer: BC Managed Care – PPO | Attending: Emergency Medicine | Admitting: Emergency Medicine

## 2020-03-11 DIAGNOSIS — R000 Tachycardia, unspecified: Secondary | ICD-10-CM | POA: Diagnosis not present

## 2020-03-11 DIAGNOSIS — R202 Paresthesia of skin: Secondary | ICD-10-CM | POA: Insufficient documentation

## 2020-03-11 DIAGNOSIS — R072 Precordial pain: Secondary | ICD-10-CM | POA: Diagnosis not present

## 2020-03-11 DIAGNOSIS — F17210 Nicotine dependence, cigarettes, uncomplicated: Secondary | ICD-10-CM | POA: Diagnosis not present

## 2020-03-11 DIAGNOSIS — R0789 Other chest pain: Secondary | ICD-10-CM | POA: Diagnosis present

## 2020-03-11 LAB — COMPREHENSIVE METABOLIC PANEL
ALT: 42 U/L (ref 0–44)
AST: 32 U/L (ref 15–41)
Albumin: 4.5 g/dL (ref 3.5–5.0)
Alkaline Phosphatase: 64 U/L (ref 38–126)
Anion gap: 8 (ref 5–15)
BUN: 11 mg/dL (ref 6–20)
CO2: 27 mmol/L (ref 22–32)
Calcium: 9.5 mg/dL (ref 8.9–10.3)
Chloride: 103 mmol/L (ref 98–111)
Creatinine, Ser: 1.19 mg/dL (ref 0.61–1.24)
GFR, Estimated: >60 mL/min (ref >60)
Glucose, Bld: 105 mg/dL — ABNORMAL HIGH (ref 70–99)
Potassium: 3.6 mmol/L (ref 3.5–5.1)
Sodium: 138 mmol/L (ref 135–145)
Total Bilirubin: 0.6 mg/dL (ref 0.3–1.2)
Total Protein: 7.8 g/dL (ref 6.5–8.1)

## 2020-03-11 LAB — CBC WITH DIFFERENTIAL/PLATELET
Abs Immature Granulocytes: 0.01 10*3/uL (ref 0.00–0.07)
Basophils Absolute: 0 10*3/uL (ref 0.0–0.1)
Basophils Relative: 0 %
Eosinophils Absolute: 0.2 10*3/uL (ref 0.0–0.5)
Eosinophils Relative: 3 %
HCT: 44.3 % (ref 39.0–52.0)
Hemoglobin: 15.3 g/dL (ref 13.0–17.0)
Immature Granulocytes: 0 %
Lymphocytes Relative: 40 %
Lymphs Abs: 3.3 10*3/uL (ref 0.7–4.0)
MCH: 31.7 pg (ref 26.0–34.0)
MCHC: 34.5 g/dL (ref 30.0–36.0)
MCV: 91.9 fL (ref 80.0–100.0)
Monocytes Absolute: 0.7 10*3/uL (ref 0.1–1.0)
Monocytes Relative: 8 %
Neutro Abs: 4.2 10*3/uL (ref 1.7–7.7)
Neutrophils Relative %: 49 %
Platelets: 305 10*3/uL (ref 150–400)
RBC: 4.82 MIL/uL (ref 4.22–5.81)
RDW: 14.6 % (ref 11.5–15.5)
WBC: 8.4 10*3/uL (ref 4.0–10.5)
nRBC: 0 % (ref 0.0–0.2)

## 2020-03-11 LAB — TROPONIN I (HIGH SENSITIVITY): Troponin I (High Sensitivity): 3 ng/L (ref <18)

## 2020-03-11 LAB — D-DIMER, QUANTITATIVE: D-Dimer, Quant: 0.36 ug/mL-FEU (ref 0.00–0.50)

## 2020-03-11 MED ORDER — DICLOFENAC SODIUM 1 % EX GEL: 2.0000 g | Freq: Four times a day (QID) | CUTANEOUS | 0 refills | Status: DC | PRN

## 2020-03-11 MED ORDER — IBUPROFEN 600 MG PO TABS: 600.0000 mg | ORAL_TABLET | Freq: Four times a day (QID) | ORAL | 0 refills | Status: DC | PRN

## 2020-03-11 NOTE — ED Provider Notes (Signed)
Emergency Department Provider Note   I have reviewed the triage vital signs and the nursing notes.   HISTORY  Chief Complaint Chest Pain   HPI George Huerta is a 35 y.o. male with past medical history reviewed below presents to the emergency department for evaluation of intermittent chest discomfort over the past several weeks.  He recovered from COVID-19 with diagnosis 3 weeks ago.  He denies any severe symptoms, shortness of breath, chest pain during his illness.  He is noted some occasional left anterior, upper chest discomfort over the past several weeks which seems somewhat worse with breathing.  He did not feel short of breath.  He has since developed some intermittent right anterior chest discomfort similar to that but more diffuse.  He occasionally has tingling in the right arm worse today than normal.  He notes that since the hand surgery several years ago he will have occasional tingling in the arm without neck pain but having the symptoms in addition to the chest discomfort made him concerned and so presents to the ED.  He denies any weakness in the right arm.  No leg symptoms.  No face weakness/numbness.  No vision change or difficulty swallowing. No modifying factors.      Past Medical History:  Diagnosis Date  . Uvulitis     There are no problems to display for this patient.   Past Surgical History:  Procedure Laterality Date  . HAND SURGERY      Allergies Patient has no known allergies.  No family history on file.  Social History Social History   Tobacco Use  . Smoking status: Current Every Day Smoker    Packs/day: 1.00    Types: Cigarettes  . Smokeless tobacco: Never Used  Substance Use Topics  . Alcohol use: Yes    Comment: social  . Drug use: No    Review of Systems  Constitutional: No fever/chills Eyes: No visual changes. ENT: No sore throat. Cardiovascular: Positive chest pain. Respiratory: Denies shortness of breath. Gastrointestinal:  No abdominal pain.  No nausea, no vomiting.  No diarrhea.  No constipation. Genitourinary: Negative for dysuria. Musculoskeletal: Negative for back pain. Skin: Negative for rash. Neurological: Negative for headaches, focal weakness or numbness. Paresthesias in the right arm/hand intermittently.   10-point ROS otherwise negative.  ____________________________________________   PHYSICAL EXAM:  VITAL SIGNS: ED Triage Vitals  Enc Vitals Group     BP 03/11/20 1545 (!) 154/100     Pulse Rate 03/11/20 1545 83     Resp 03/11/20 1545 19     Temp 03/11/20 1545 98.4 F (36.9 C)     Temp Source 03/11/20 1545 Oral     SpO2 03/11/20 1545 100 %     Weight 03/11/20 1545 200 lb (90.7 kg)     Height 03/11/20 1545 6\' 1"  (1.854 m)    Constitutional: Alert and oriented. Well appearing and in no acute distress. Eyes: Conjunctivae are normal. Head: Atraumatic. Nose: No congestion/rhinnorhea. Mouth/Throat: Mucous membranes are moist.   Neck: No stridor.   Cardiovascular: Normal rate, regular rhythm. Good peripheral circulation. Grossly normal heart sounds.   Respiratory: Normal respiratory effort.  No retractions. Lungs CTAB. Gastrointestinal: Soft and nontender. No distention.  Musculoskeletal: No lower extremity tenderness nor edema. No gross deformities of extremities. Neurologic:  Normal speech and language. No gross focal neurologic deficits are appreciated.  5/5 strength in the bilateral upper and lower extremities.  No pronator drift.  Normal sensation in the bilateral upper extremities  in the forearm and hand. No facial asymmetry.  Skin:  Skin is warm, dry and intact. No rash noted.   ____________________________________________   LABS (all labs ordered are listed, but only abnormal results are displayed)  Labs Reviewed  COMPREHENSIVE METABOLIC PANEL - Abnormal; Notable for the following components:      Result Value   Glucose, Bld 105 (*)    All other components within normal  limits  CBC WITH DIFFERENTIAL/PLATELET  D-DIMER, QUANTITATIVE (NOT AT [facility])  D-DIMER, QUANTITATIVE  TROPONIN I (HIGH SENSITIVITY)  TROPONIN I (HIGH SENSITIVITY)   ____________________________________________  EKG   EKG Interpretation  Date/Time:  Tuesday March 11 2020 15:40:04 EST Ventricular Rate:  101 PR Interval:  142 QRS Duration: 92 QT Interval:  344 QTC Calculation: 446 R Axis:   90 Text Interpretation: Sinus tachycardia Rightward axis Borderline ECG No significant change since last tracing Confirmed by Alona Bene [phone]) on 03/11/2020 3:53:14 PM       ____________________________________________  RADIOLOGY  DG Chest Portable 1 View  Result Date: 03/11/2020 CLINICAL DATA:  Chest pain. EXAM: PORTABLE CHEST 1 VIEW COMPARISON:  August 10, 2017. FINDINGS: The heart size and mediastinal contours are within normal limits. Both lungs are clear. No pneumothorax or pleural effusion is noted. The visualized skeletal structures are unremarkable. IMPRESSION: No active disease. Electronically Signed   By: Lupita Raider M.D.   On: 03/11/2020 16:38    ____________________________________________   PROCEDURES  Procedure(s) performed:   Procedures  None  ____________________________________________   INITIAL IMPRESSION / ASSESSMENT AND PLAN / ED COURSE  Pertinent labs & imaging results that were available during my care of the patient were reviewed by me and considered in my medical decision making (see chart for details).   Patient presents emergency department for evaluation of chest discomfort.  The pain is fairly atypical, intermittent, alternating sides.  He is at somewhat increased risk for DVT/PE given his recent COVID infection.  He is a smoker and is having increased stress. Lower suspicion for ACS. EKG reviewed by me with some mild tachycardia. Plan for troponin and d-dimer along with CXR.  My suspicion for stroke or critical cervical spine stenosis is very low.  The  patient has had intermittent symptoms since his hand surgery and he has no focal deficits on my exam.  Do not plan on neuro imaging on an emergent basis.   Troponin is negative along with negative D-dimer.  Patient has had more than 6 hours of constant pain and so do not plan on trending troponin.  CBC and chemistry are similarly normal.  Chest x-ray with no acute findings.  We discussed smoking cessation as well as ED return precautions in detail.  Provided contact information for a local PCP with instructions to call the office tomorrow and schedule the next available appointment. ____________________________________________  FINAL CLINICAL IMPRESSION(S) / ED DIAGNOSES  Final diagnoses:  Precordial chest pain    NEW OUTPATIENT MEDICATIONS STARTED DURING THIS VISIT:  New Prescriptions   DICLOFENAC SODIUM (VOLTAREN) 1 % GEL    Apply 2 g topically 4 (four) times daily as needed.   IBUPROFEN (ADVIL) 600 MG TABLET    Take 1 tablet (600 mg total) by mouth every 6 (six) hours as needed.    Note:  This document was prepared using Dragon voice recognition software and may include unintentional dictation errors.  Alona Bene, MD, [facility] Emergency Medicine    Devlin Brink, Arlyss Repress, MD 03/11/20 [phone]

## 2020-03-11 NOTE — Discharge Instructions (Signed)
You were seen in the emerge department today with chest discomfort.  Have called into medicines to the pharmacy to help with symptoms.  We discussed reducing stress levels and smoking cessation as ways to prevent heart issues in the future.  If he develop worsening pain in your chest, trouble breathing, numbness, weakness you should call 911 and/or return to the emergency department.  Please establish care with a primary care doctor if you do not already have one.

## 2020-03-11 NOTE — ED Triage Notes (Addendum)
Reports chest pain that's been going back and forth from right to left for several days.  Now having weakness in right arm with tingling.  Also endorses having hx of anxiety and feeling anxious now.

## 2020-03-16 IMAGING — DX DG CHEST 2V
3 series · 3 of 3 positions shown · non-contrast
Comparison: Prior radiograph from 12/16/2006.

CLINICAL DATA: Initial evaluation for acute chest pain.

EXAM:
CHEST - 2 VIEW

[chest pa]
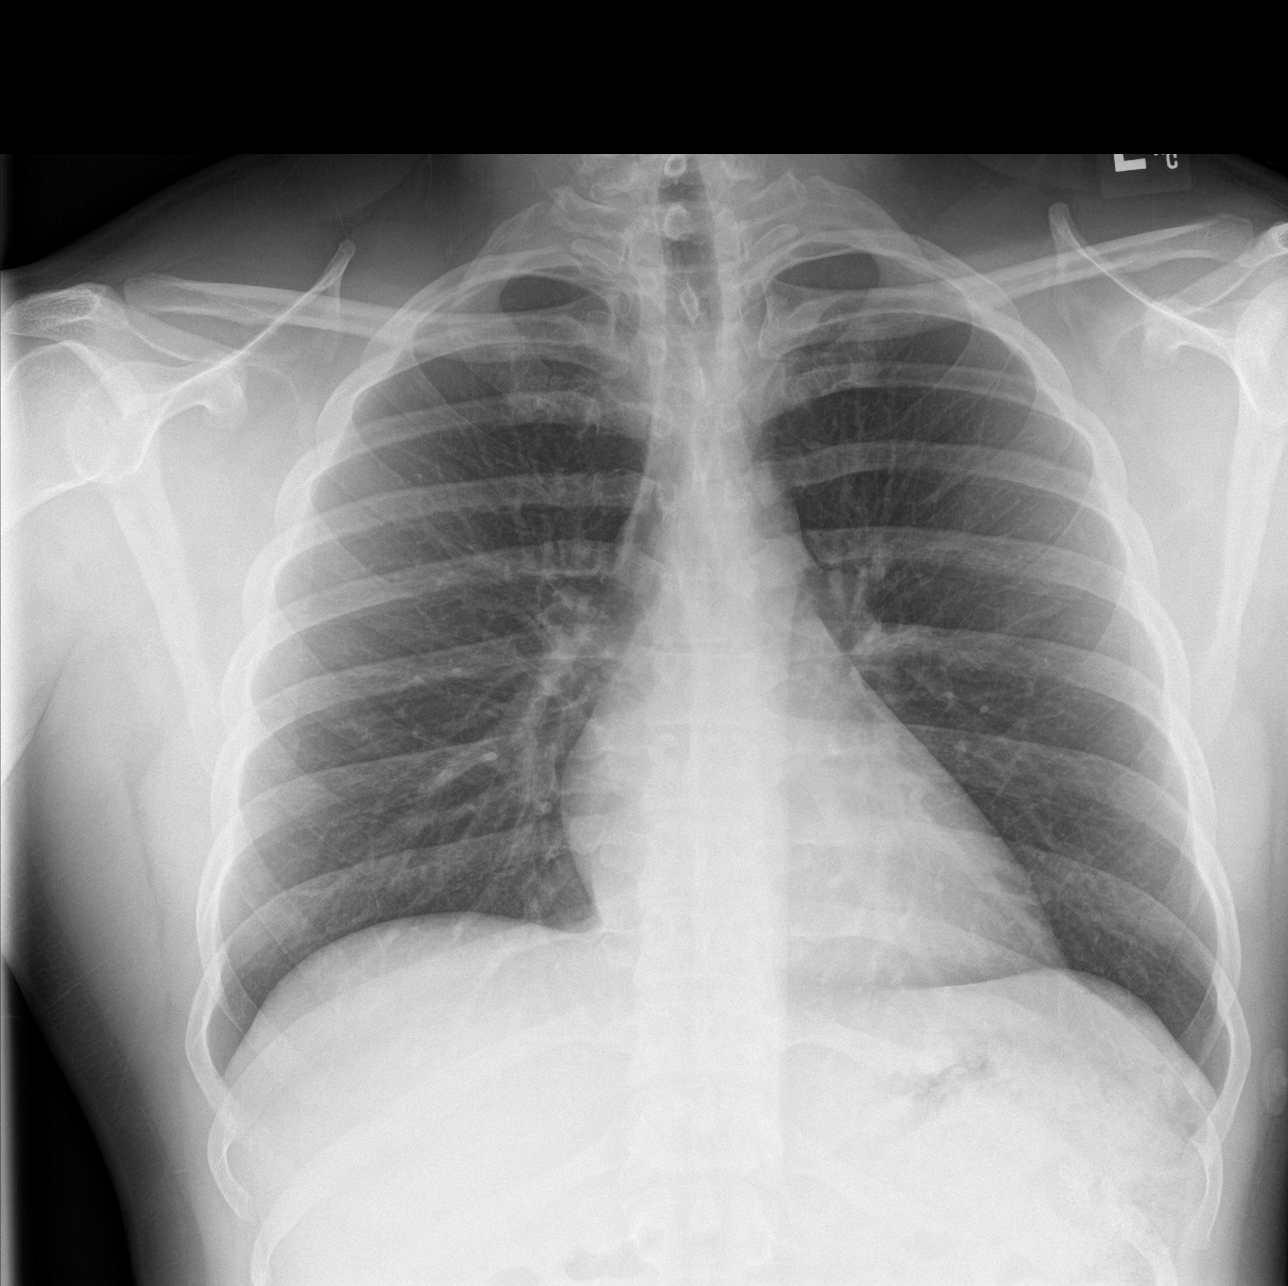

[chest lat (1 of 2)]
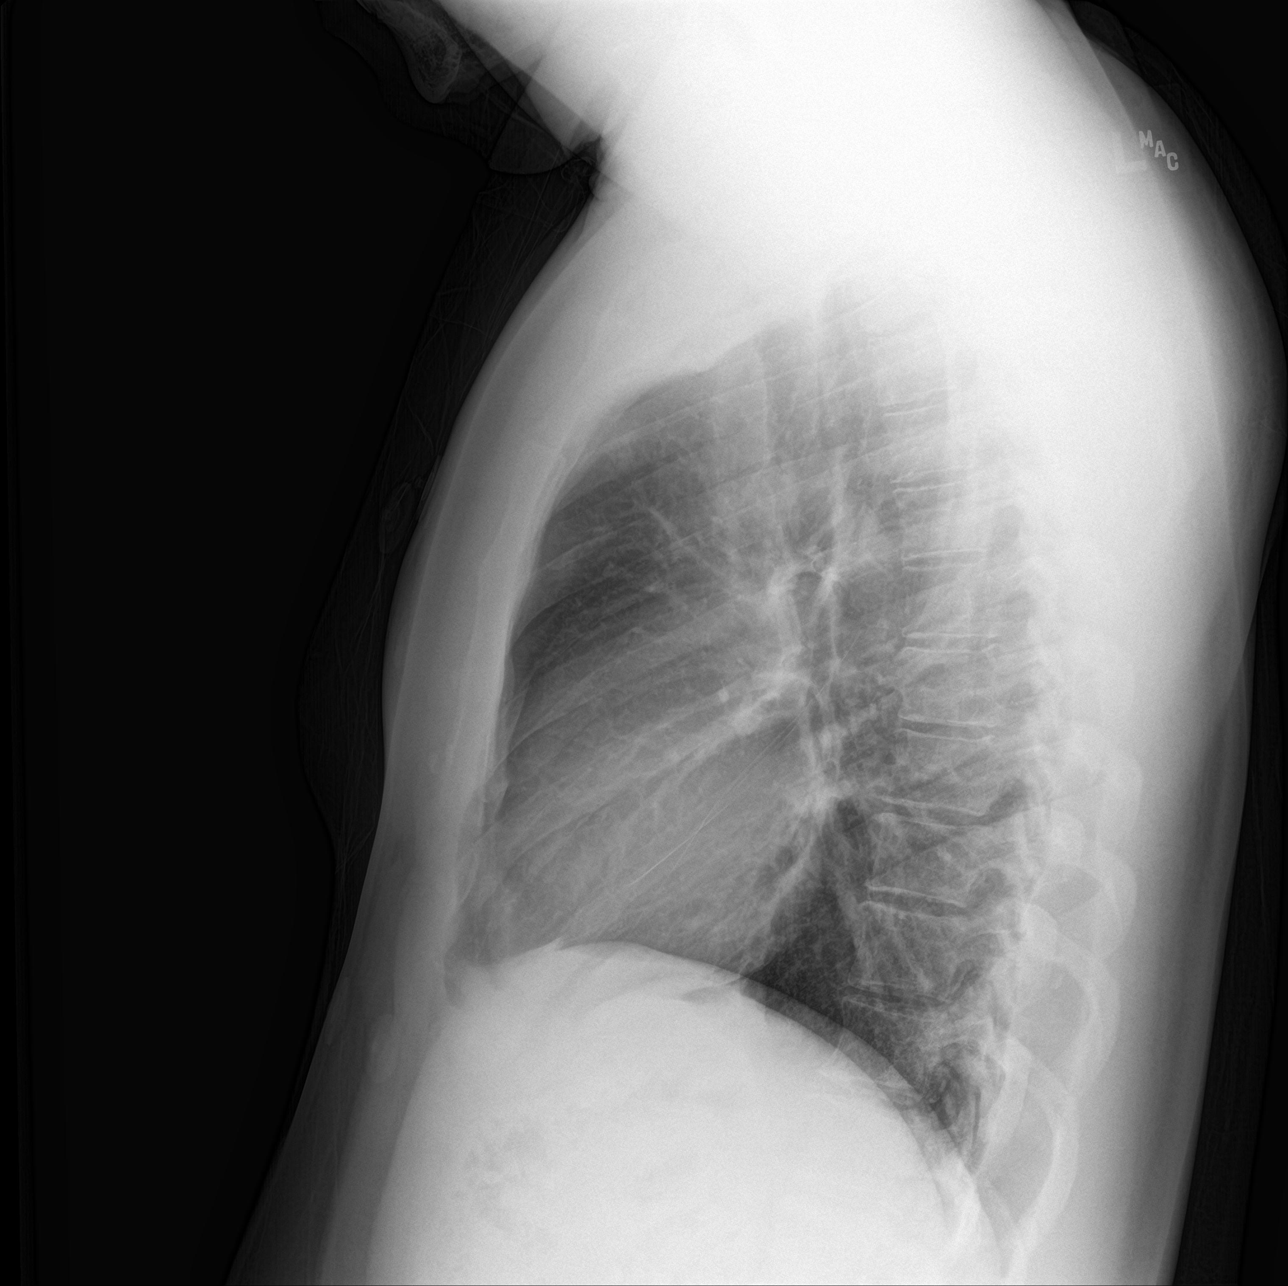

[chest lat (2 of 2)]
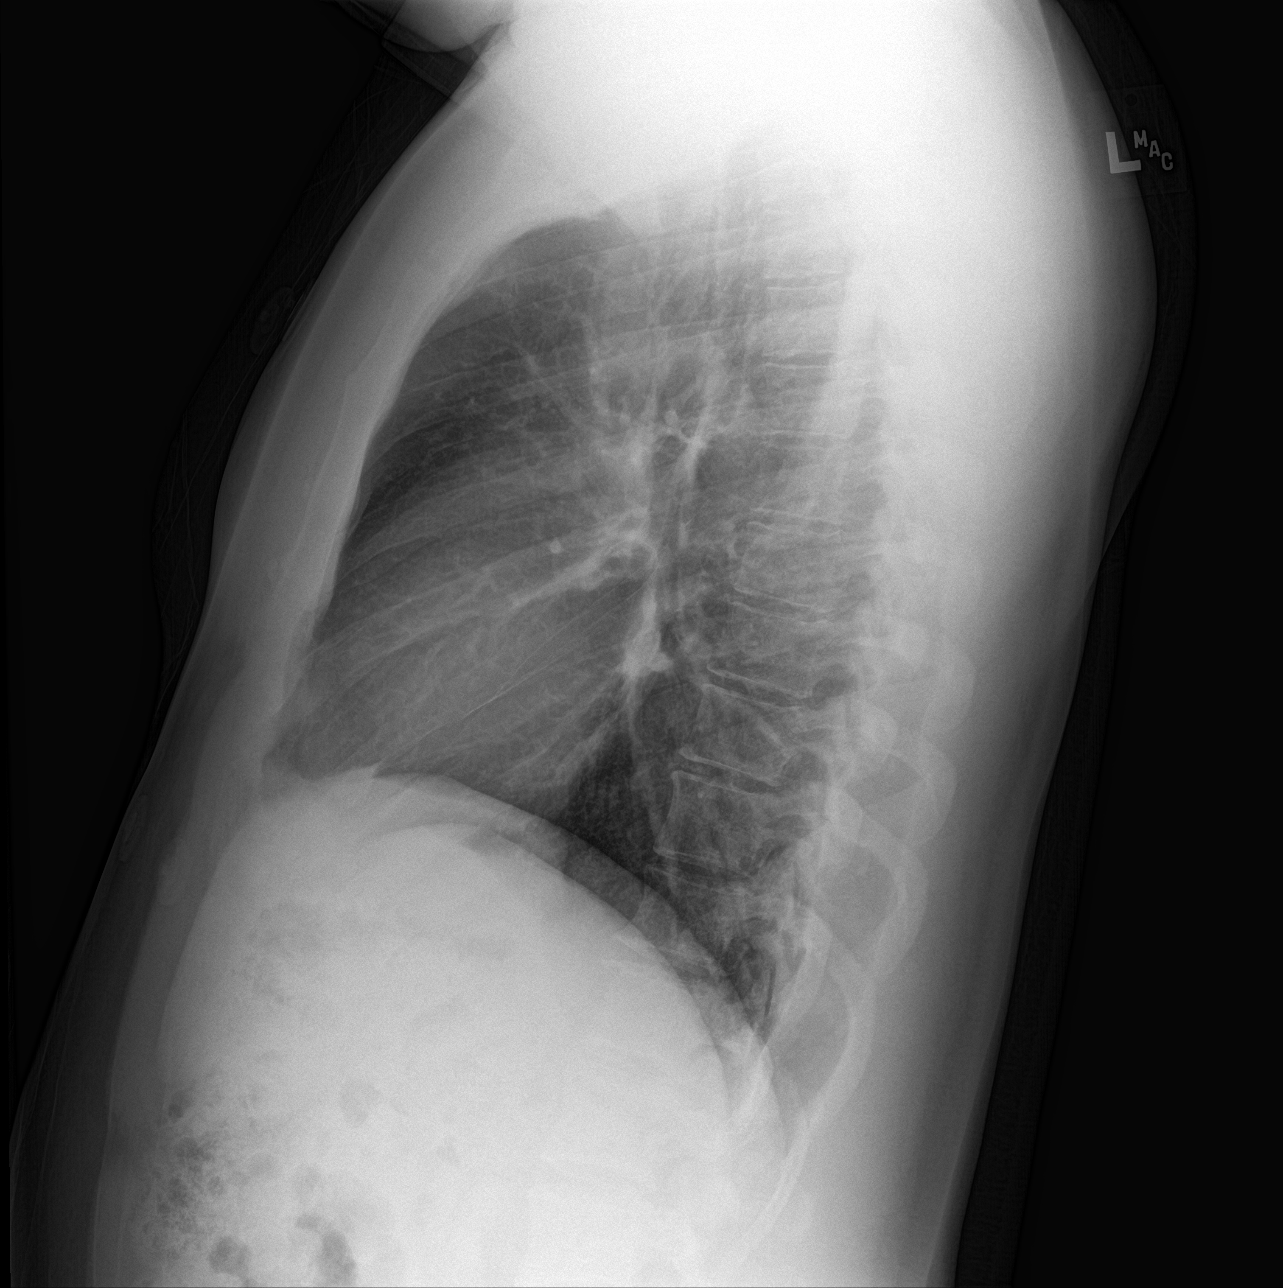

[3 of 3 positions shown; findings below may reference images not displayed]

FINDINGS: The cardiac and mediastinal silhouettes are stable in size and
contour, and remain within normal limits.

The lungs are normally inflated. No airspace consolidation, pleural
effusion, or pulmonary edema is identified. There is no
pneumothorax.

No acute osseous abnormality identified.
IMPRESSION: No active cardiopulmonary disease.

## 2020-07-28 ENCOUNTER — Encounter (HOSPITAL_BASED_OUTPATIENT_CLINIC_OR_DEPARTMENT_OTHER): Payer: Self-pay | Admitting: Emergency Medicine

## 2020-07-28 ENCOUNTER — Emergency Department (HOSPITAL_BASED_OUTPATIENT_CLINIC_OR_DEPARTMENT_OTHER): Payer: BC Managed Care – PPO

## 2020-07-28 ENCOUNTER — Emergency Department (HOSPITAL_BASED_OUTPATIENT_CLINIC_OR_DEPARTMENT_OTHER)
Admission: EM | Admit: 2020-07-28 | Discharge: 2020-07-28 | Disposition: A | Discharge status: Home / Self Care | Payer: BC Managed Care – PPO | Attending: Emergency Medicine | Admitting: Emergency Medicine

## 2020-07-28 ENCOUNTER — Other Ambulatory Visit: Payer: Self-pay

## 2020-07-28 DIAGNOSIS — F17210 Nicotine dependence, cigarettes, uncomplicated: Secondary | ICD-10-CM | POA: Diagnosis not present

## 2020-07-28 DIAGNOSIS — R079 Chest pain, unspecified: Secondary | ICD-10-CM | POA: Insufficient documentation

## 2020-07-28 LAB — CBC
HCT: 45.8 % (ref 39.0–52.0)
Hemoglobin: 15.8 g/dL (ref 13.0–17.0)
MCH: 32 pg (ref 26.0–34.0)
MCHC: 34.5 g/dL (ref 30.0–36.0)
MCV: 92.9 fL (ref 80.0–100.0)
Platelets: 303 10*3/uL (ref 150–400)
RBC: 4.93 MIL/uL (ref 4.22–5.81)
RDW: 14.5 % (ref 11.5–15.5)
WBC: 9 10*3/uL (ref 4.0–10.5)
nRBC: 0 % (ref 0.0–0.2)

## 2020-07-28 LAB — BASIC METABOLIC PANEL
Anion gap: 9 (ref 5–15)
BUN: 13 mg/dL (ref 6–20)
CO2: 27 mmol/L (ref 22–32)
Calcium: 9.2 mg/dL (ref 8.9–10.3)
Chloride: 102 mmol/L (ref 98–111)
Creatinine, Ser: 1.19 mg/dL (ref 0.61–1.24)
GFR, Estimated: >60 mL/min (ref >60)
Glucose, Bld: 123 mg/dL — ABNORMAL HIGH (ref 70–99)
Potassium: 3.8 mmol/L (ref 3.5–5.1)
Sodium: 138 mmol/L (ref 135–145)

## 2020-07-28 LAB — TROPONIN I (HIGH SENSITIVITY): Troponin I (High Sensitivity): 3 ng/L (ref <18)

## 2020-07-28 NOTE — Discharge Instructions (Addendum)
Follow up with your primary care doctor. Come back for worsening pain, difficulty breathing or other new concerning symptom.

## 2020-07-28 NOTE — ED Triage Notes (Signed)
Reports left sided chest pain described as constant aching that started this morning.  Had a similar episode a few days ago that went away on its own.  Also endorses a little nausea.

## 2020-07-29 NOTE — ED Provider Notes (Signed)
[facility] EMERGENCY DEPARTMENT Provider Note   CSN: [id number] Arrival date & time: 07/28/20  4854     History Chief Complaint  Patient presents with  . Chest Pain    George Huerta is a 35 y.o. male.  Presented to ER with concern for chest pain.  Symptoms started this morning upon waking, left-sided, aching, nonradiating.  Currently mild.  No associated symptoms.  No difficulty in breathing.  No apparent alleviating or aggravating factors.  No association with exertion.  Denies any recent heavy lifting, no injuries.  Has history of tobacco abuse.  Denies family history of coronary artery disease.  He denies any chronic medical problems.  HPI     Past Medical History:  Diagnosis Date  . Uvulitis     There are no problems to display for this patient.   Past Surgical History:  Procedure Laterality Date  . HAND SURGERY         No family history on file.  Social History   Tobacco Use  . Smoking status: Every Day    Packs/day: 1.00    Types: Cigarettes  . Smokeless tobacco: Never  Substance Use Topics  . Alcohol use: Yes    Comment: social  . Drug use: No    Home Medications Prior to Admission medications   Medication Sig Start Date End Date Taking? Authorizing Provider  diclofenac Sodium (VOLTAREN) 1 % GEL Apply 2 g topically 4 (four) times daily as needed. 03/11/20   Long, Arlyss Repress, MD  ibuprofen (ADVIL) 600 MG tablet Take 1 tablet (600 mg total) by mouth every 6 (six) hours as needed. 03/11/20   Long, Arlyss Repress, MD    Allergies    Patient has no known allergies.  Review of Systems   Review of Systems  Constitutional:  Negative for chills and fever.  HENT:  Negative for ear pain and sore throat.   Eyes:  Negative for pain and visual disturbance.  Respiratory:  Negative for cough and shortness of breath.   Cardiovascular:  Positive for chest pain. Negative for palpitations.  Gastrointestinal:  Negative for abdominal pain and vomiting.   Genitourinary:  Negative for dysuria and hematuria.  Musculoskeletal:  Negative for arthralgias and back pain.  Skin:  Negative for color change and rash.  Neurological:  Negative for seizures and syncope.  All other systems reviewed and are negative.  Physical Exam Updated Vital Signs BP 109/66 (BP Location: Right Arm)   Pulse (!) 58   Temp 98.7 F (37.1 C) (Oral)   Resp 16   Ht 6\' 1"  (1.854 m)   Wt 90.7 kg   SpO2 99%   BMI 26.39 kg/m   Physical Exam Vitals and nursing note reviewed.  Constitutional:      Appearance: He is well-developed.  HENT:     Head: Normocephalic and atraumatic.  Eyes:     Conjunctiva/sclera: Conjunctivae normal.  Cardiovascular:     Rate and Rhythm: Normal rate and regular rhythm.     Heart sounds: No murmur heard. Pulmonary:     Effort: Pulmonary effort is normal. No respiratory distress.     Breath sounds: Normal breath sounds.  Abdominal:     Palpations: Abdomen is soft.     Tenderness: There is no abdominal tenderness.  Musculoskeletal:     Cervical back: Neck supple.  Skin:    General: Skin is warm and dry.  Neurological:     General: No focal deficit present.     Mental  Status: He is alert.  Psychiatric:        Mood and Affect: Mood normal.        Behavior: Behavior normal.    ED Results / Procedures / Treatments   Labs (all labs ordered are listed, but only abnormal results are displayed) Labs Reviewed  BASIC METABOLIC PANEL - Abnormal; Notable for the following components:      Result Value   Glucose, Bld 123 (*)    All other components within normal limits  CBC  TROPONIN I (HIGH SENSITIVITY)  TROPONIN I (HIGH SENSITIVITY)    EKG EKG Interpretation  Date/Time:  Monday July 28 2020 06:33:27 EDT Ventricular Rate:  68 PR Interval:  166 QRS Duration: 99 QT Interval:  390 QTC Calculation: 415 R Axis:   78 Text Interpretation: Sinus rhythm Normal ECG Confirmed by Geoffery Lyons ([id number]) on 07/28/2020 6:51:56  AM  Radiology DG Chest 2 View  Result Date: 07/28/2020 CLINICAL DATA:  Chest pain EXAM: CHEST - 2 VIEW COMPARISON:  March 11, 2020 FINDINGS: The heart size and mediastinal contours are within normal limits. Both lungs are clear. The visualized skeletal structures are unremarkable. IMPRESSION: No active cardiopulmonary disease. Electronically Signed   By: Maudry Mayhew MD   On: 07/28/2020 07:34    Procedures Procedures   Medications Ordered in ED Medications - No data to display  ED Course  I have reviewed the triage vital signs and the nursing notes.  Pertinent labs & imaging results that were available during my care of the patient were reviewed by me and considered in my medical decision making (see chart for details).    MDM Rules/Calculators/A&P                          35 year old male presents to ER with concern for chest pain.  On exam patient appears remarkably well-appearing in no acute distress.  Has history of smoking but no other cardiac risk factors.  EKG was normal, troponin 3, based on these findings and description of symptoms and history, very low suspicion for ACS.  His chest x-ray was negative for any acute cardiopulmonary disease.  Remainder basic labs are stable.  On reassessment did not have any ongoing symptoms.  Vitals are stable.  Suspect more likely MSK in nature.  Recommend follow-up with primary doctor.  Reviewed return precautions and discharged.   After the discussed management above, the patient was determined to be safe for discharge.  The patient was in agreement with this plan and all questions regarding their care were answered.  ED return precautions were discussed and the patient will return to the ED with any significant worsening of condition.  Final Clinical Impression(s) / ED Diagnoses Final diagnoses:  Chest pain, unspecified type    Rx / DC Orders ED Discharge Orders     None        Milagros Loll, MD 07/29/20 1109

## 2020-11-25 ENCOUNTER — Encounter: Admit: 2020-11-25 | Payer: PRIVATE HEALTH INSURANCE | Primary: Internal Medicine

## 2020-11-25 DIAGNOSIS — Z3009 Encounter for other general counseling and advice on contraception: Secondary | ICD-10-CM

## 2021-01-05 ENCOUNTER — Other Ambulatory Visit: Payer: Self-pay

## 2021-01-05 ENCOUNTER — Emergency Department (HOSPITAL_BASED_OUTPATIENT_CLINIC_OR_DEPARTMENT_OTHER)
Admission: EM | Admit: 2021-01-05 | Discharge: 2021-01-05 | Disposition: A | Discharge status: Home / Self Care | Payer: BC Managed Care – PPO | Attending: Emergency Medicine | Admitting: Emergency Medicine

## 2021-01-05 ENCOUNTER — Encounter (HOSPITAL_BASED_OUTPATIENT_CLINIC_OR_DEPARTMENT_OTHER): Payer: Self-pay | Admitting: *Deleted

## 2021-01-05 DIAGNOSIS — F17210 Nicotine dependence, cigarettes, uncomplicated: Secondary | ICD-10-CM | POA: Diagnosis not present

## 2021-01-05 DIAGNOSIS — R519 Headache, unspecified: Secondary | ICD-10-CM | POA: Diagnosis present

## 2021-01-05 DIAGNOSIS — Z20822 Contact with and (suspected) exposure to covid-19: Secondary | ICD-10-CM | POA: Diagnosis not present

## 2021-01-05 DIAGNOSIS — H00012 Hordeolum externum right lower eyelid: Secondary | ICD-10-CM

## 2021-01-05 LAB — CBC WITH DIFFERENTIAL/PLATELET
Abs Immature Granulocytes: 0.01 10*3/uL (ref 0.00–0.07)
Basophils Absolute: 0 10*3/uL (ref 0.0–0.1)
Basophils Relative: 0 %
Eosinophils Absolute: 0.3 10*3/uL (ref 0.0–0.5)
Eosinophils Relative: 5 %
HCT: 47.9 % (ref 39.0–52.0)
Hemoglobin: 16.2 g/dL (ref 13.0–17.0)
Immature Granulocytes: 0 %
Lymphocytes Relative: 29 %
Lymphs Abs: 1.9 10*3/uL (ref 0.7–4.0)
MCH: 32 pg (ref 26.0–34.0)
MCHC: 33.8 g/dL (ref 30.0–36.0)
MCV: 94.7 fL (ref 80.0–100.0)
Monocytes Absolute: 0.5 10*3/uL (ref 0.1–1.0)
Monocytes Relative: 7 %
Neutro Abs: 4 10*3/uL (ref 1.7–7.7)
Neutrophils Relative %: 59 %
Platelets: 283 10*3/uL (ref 150–400)
RBC: 5.06 MIL/uL (ref 4.22–5.81)
RDW: 14.7 % (ref 11.5–15.5)
WBC: 6.7 10*3/uL (ref 4.0–10.5)
nRBC: 0 % (ref 0.0–0.2)

## 2021-01-05 LAB — BASIC METABOLIC PANEL
Anion gap: 9 (ref 5–15)
BUN: 13 mg/dL (ref 6–20)
CO2: 28 mmol/L (ref 22–32)
Calcium: 9.4 mg/dL (ref 8.9–10.3)
Chloride: 101 mmol/L (ref 98–111)
Creatinine, Ser: 1.29 mg/dL — ABNORMAL HIGH (ref 0.61–1.24)
GFR, Estimated: >60 mL/min (ref >60)
Glucose, Bld: 97 mg/dL (ref 70–99)
Potassium: 4.2 mmol/L (ref 3.5–5.1)
Sodium: 138 mmol/L (ref 135–145)

## 2021-01-05 LAB — RESP PANEL BY RT-PCR (FLU A&B, COVID) ARPGX2
Influenza A by PCR: NEGATIVE
Influenza B by PCR: NEGATIVE
SARS Coronavirus 2 by RT PCR: NEGATIVE

## 2021-01-05 LAB — URINALYSIS, MICROSCOPIC (REFLEX)

## 2021-01-05 LAB — URINALYSIS, ROUTINE W REFLEX MICROSCOPIC
Bilirubin Urine: NEGATIVE
Glucose, UA: NEGATIVE mg/dL
Ketones, ur: NEGATIVE mg/dL
Leukocytes,Ua: NEGATIVE
Nitrite: NEGATIVE
Protein, ur: NEGATIVE mg/dL
Specific Gravity, Urine: >=1.03 (ref 1.005–1.030)
pH: 6 (ref 5.0–8.0)

## 2021-01-05 NOTE — ED Notes (Signed)
ED Provider at bedside. 

## 2021-01-05 NOTE — ED Provider Notes (Signed)
[facility] EMERGENCY DEPARTMENT Provider Note   CSN: [id number] Arrival date & time: 01/05/21  1201     History No chief complaint on file.   George Huerta is a 35 y.o. male.  HPI   Patient with no available medical history presents due to headaches and dizziness.  Reports he has been having headaches intermittently for the last 2 days.  His girlfriend gave him Tylenol which did help reduce the headaches.  Denies any nausea or vomiting, he feels the headache in the back of his head primarily.  It comes and goes throughout the day, not having any chest pain or shortness of breath.  Does endorse nasal congestion and worsening body aches.  Also concerned about a stye on his right eye.  The dizziness happened acutely this morning when he went from sitting to standing, no decrease in oral intake at home.  Feeling the room was spinning briefly for a few seconds, did not lose consciousness.  Patient also checked his blood pressure this morning and was supposedly 170/100, no history of hypertension.  This prompted patient to come to ED for evaluation.  Past Medical History:  Diagnosis Date   Uvulitis     There are no problems to display for this patient.   Past Surgical History:  Procedure Laterality Date   HAND SURGERY         No family history on file.  Social History   Tobacco Use   Smoking status: Every Day    Packs/day: 1.00    Types: Cigarettes   Smokeless tobacco: Never  Vaping Use   Vaping Use: Never used  Substance Use Topics   Alcohol use: Yes    Comment: social   Drug use: No    Home Medications Prior to Admission medications   Medication Sig Start Date End Date Taking? Authorizing Provider  diclofenac Sodium (VOLTAREN) 1 % GEL Apply 2 g topically 4 (four) times daily as needed. 03/11/20   Long, Arlyss Repress, MD  ibuprofen (ADVIL) 600 MG tablet Take 1 tablet (600 mg total) by mouth every 6 (six) hours as needed. 03/11/20   Long, Arlyss Repress, MD     Allergies    Patient has no known allergies.  Review of Systems   All systems reviewed and are negative except as documented in history of present illness above.   Physical Exam Updated Vital Signs BP 135/79 (BP Location: Right Arm)    Pulse 68    Temp 98.2 F (36.8 C) (Oral)    Resp 19    Ht 6\' 1"  (1.854 m)    Wt 90.7 kg    SpO2 100%    BMI 26.38 kg/m   Physical Exam Vitals and nursing note reviewed. Exam conducted with a chaperone present.  Constitutional:      Appearance: Normal appearance.  HENT:     Head: Normocephalic and atraumatic.  Eyes:     General: No scleral icterus.       Right eye: No discharge.        Left eye: No discharge.     Extraocular Movements: Extraocular movements intact.     Pupils: Pupils are equal, round, and reactive to light.     Comments: Stye to right lower eyelid, no nystagmus  Cardiovascular:     Rate and Rhythm: Normal rate and regular rhythm.     Pulses: Normal pulses.     Heart sounds: Normal heart sounds. No murmur heard.   No friction rub. No  gallop.  Pulmonary:     Effort: Pulmonary effort is normal. No respiratory distress.     Breath sounds: Normal breath sounds.     Comments: Lungs are clear to auscultation bilaterally Abdominal:     General: Abdomen is flat. Bowel sounds are normal. There is no distension.     Palpations: Abdomen is soft.     Tenderness: There is no abdominal tenderness.  Musculoskeletal:        General: No tenderness. Normal range of motion.     Right lower leg: No edema.     Left lower leg: No edema.  Skin:    General: Skin is warm and dry.     Coloration: Skin is not jaundiced.  Neurological:     Mental Status: He is alert. Mental status is at baseline.     Coordination: Coordination normal.     Comments: Cranial nerves III through XII are grossly intact, grip strength equal bilaterally   ED Results / Procedures / Treatments   Labs (all labs ordered are listed, but only abnormal results are  displayed) Labs Reviewed  URINALYSIS, ROUTINE W REFLEX MICROSCOPIC - Abnormal; Notable for the following components:      Result Value   Hgb urine dipstick TRACE (*)    All other components within normal limits  URINALYSIS, MICROSCOPIC (REFLEX) - Abnormal; Notable for the following components:   Bacteria, UA RARE (*)    All other components within normal limits  RESP PANEL BY RT-PCR (FLU A&B, COVID) [id number]  BASIC METABOLIC PANEL  CBC WITH DIFFERENTIAL/PLATELET  CBG MONITORING, ED    EKG None  Radiology No results found.  Procedures Procedures   Medications Ordered in ED Medications - No data to display  ED Course  I have reviewed the triage vital signs and the nursing notes.  Pertinent labs & imaging results that were available during my care of the patient were reviewed by me and considered in my medical decision making (see chart for details).    MDM Rules/Calculators/A&P                         This is a 35 year old male with no pertinent medical history presenting with multiple complaints.  His blood pressure stable in the ED, repeat blood pressures have remained stable without any hypertension.  Do not think he needs discharged on hypertension.  Did check basic labs for signs hypertensive urgency, creatinine slightly elevated but not consistent with an AKI.  Additionally he has normal urine output, no anemia or leukocytosis.  The headache does not appear to be an SAH, additionally his blood pressure is very well controlled.  Responds well to medicine does not worse headache of life.  Do not think patient needs CT scan at this time.  COVID test ordered due to persistent headaches, it was negative.  Patient does have a stye, advised to do warm compresses.  Do not think additional work-up needed, patient discharged in stable condition.    Final Clinical Impression(s) / ED Diagnoses Final diagnoses:  None    Rx / DC Orders ED Discharge Orders     None         Theron Arista, Cordelia Poche 01/05/21 1619    Cheryll Cockayne, MD 01/08/21 [phone]

## 2021-01-05 NOTE — ED Notes (Addendum)
For the past 2 days has had a HA, also c/o left side pain. Denies having fevers, occ has nausea and light headed at times. Complains of having body aches.

## 2021-01-05 NOTE — ED Triage Notes (Signed)
Headache x 2 days. Dizziness. His BP was elevated today. He his not being treated for HTN.

## 2021-01-05 NOTE — Discharge Instructions (Addendum)
Your work-up today was reassuring, follow-up with your primary care doctor for recheck of your A1c.  Take Tylenol Motrin for the headache.  Do warm compresses for the stye.

## 2021-01-23 ENCOUNTER — Ambulatory Visit: Admit: 2021-01-23 | Payer: PRIVATE HEALTH INSURANCE | Attending: Urology | Primary: Internal Medicine

## 2021-01-30 ENCOUNTER — Encounter: Admit: 2021-01-30 | Payer: PRIVATE HEALTH INSURANCE | Attending: Urology | Primary: Internal Medicine

## 2021-01-30 ENCOUNTER — Ambulatory Visit: Admit: 2021-01-30 | Payer: PRIVATE HEALTH INSURANCE | Attending: Urology | Primary: Internal Medicine

## 2021-01-30 DIAGNOSIS — Z3009 Encounter for other general counseling and advice on contraception: Secondary | ICD-10-CM

## 2021-01-30 DIAGNOSIS — N399 Disorder of urinary system, unspecified: Secondary | ICD-10-CM

## 2021-01-30 MED ORDER — ATORVASTATIN 20 MG TABLET
20 mg | Status: AC
Start: 2021-01-30 — End: ?

## 2021-01-30 MED ORDER — LISINOPRIL 40 MG TABLET
40 mg | Status: AC
Start: 2021-01-30 — End: ?

## 2021-01-30 MED ORDER — ASPIRIN 81 MG TABLET,DELAYED RELEASE
81 mg | Freq: Every day | ORAL | Status: AC
Start: 2021-01-30 — End: ?

## 2021-02-22 NOTE — Progress Notes
[facility] MedicineHistory and Physical ExamName: Austin Farley: [birth year]Date of Visit: 01/26/2021 Chief Complaint:  The patient is a pleasant 36 y.o. male who arrives to discuss vasectomyHistory of Present Illness:  Austin Farley is a 36 y.o. male who arrives for initial consultation at [facility] on 01/30/2021. The patient presents today to discuss vasectomy. He is certain that he wishes to move forward with the procedure. He has 2 children (85.62 years old and 44 years old). Medical History:No past medical history on file.There are no problems to display for this patient.Surgical History:No past surgical history on file.Family History:No family history on file.History reviewed. No pertinent family history of urologic malignancy.Social History: Occupation: finance Medications:No current outpatient medications on file prior to visit. No current facility-administered medications on file prior to visit. Allergies:Not on [place] of Systems: (ROS was reviewed from prior visit)Constitutional: Denies fatigue, unintentional weight lossEyes: Denies vision changesENT: Denies new problems with hearing or  swallowingCardiovascular: Denies new chest painRespiratory: Denies new shortness of breathGI: Denies new nausea, emesis, constipation, diarrheaGU: As in HPIMusculoskeletal: Denies new joint painIntegumentary: Denies new skin rash, lesionsNeurological: Denies new headache, seizuresPsychiatric: Denies new anxiety, psychosisEndocrine: Denies new sweating, thyroid problemsHematologic/Lymphatic: Denies new bleeding, anemiaAllergic/Immunologic: Denies new fever, chills, rigorsPhysical Exam:There were no vitals taken for this visit.ECOG: 0                PAIN ASSESSMENT: 0General Appearance: Alert, cooperative, no distress, well developed,			well nourished, appears stated ageHead: Normocephalic, without obvious abnormality, atraumaticEyes: Conjunctiva and lids within normal limitsEars, Nose, Mouth: External appearance within normal limits, hearing grossly intact Neck: Supple, normal range of motionRespiratory: Non laboredAbdomen: Soft, NT/ND, no guarding, no rebound, no palpable massesBack: No CVA tenderness, no spine tendernessSkin: Color, turgor normal, no visible rashes, lesionsMusculoskeletal: Intact sensation, normal range of motionExtremities: Extremities normal, no cyanosis or edemaPsychiatric: Alert/oriented x 3, affect within normal limits, mood within normal limitsNeurological: Gait stable for age, no focal defects. motor exam grossly intactGenitourinary (Male):	Penis: within normal limits	Meatus: within normal limits	DRE: deferred (01/30/2021)	Testes: grossly intact	Epididymis: N/A	Vas deferens: bilaterally palpable, normal scrotumInternational Prostate Symptom Score (IPSS) / Sexual Health Inventory for Men (SHIM):Date: 1/20/2023IPSS Score: SHIM Score:    View : No data to display.    Diagnostic Studies:No results found for: PVRPOCTImaging Studies: n/aLaboratory: Recent PSA Results:No flowsheet data found.No results found for: UCOLOR, UGLUCOSE, UKETONE, USPECGRAVITY, UPH, UPROTEIN, UNITRATES, UBLOOD, ULEUKOCYTESPathology: n/aAssessment: 1. Vasectomy consultMedical Decision-Making: A moderate level patient consultation was performed.The patient was seen and examined.  The patient's medical history, physical exam, and relevant records were reviewed and discussed with the patient.Elective sterilization discussed in detail with patient. ?Function of vas reviewed. Patient told that vasectomy effects are not immediate and that he is not considered sterile until he has a semen analysis three months post vas which shows azoospermia. ?Until then, he must consider himself fertile and ?needs to use birth control. Advised he cannot make the assumption he is sterile simply because time has elapsed (a semen analysis is required). Also discussed that he should consider procedure irreversible.  It is possible to reverse, but reversal is not always successful and is very cost prohibitive.  Advised to not take ASA or NSAIDS at least one week prior to procedure.  ?All questions answered and patient indicated his understanding.Vasectomy was discussed in detail including local versus general anesthesia, in office versus in operating suite, and scalpel versus no-scalpel technique. The risks and complications associated with the procedure were fully explained to the patient including, but not limited to, bleeding, hematoma,  infection, hydrocele, spermatocele, lymphocele, recanalization of the vas deferens, injury to the testicle or epididymis, failure of sterilization and chronic scrotal pain.  The goals of surgery, likelihood of a successful outcome, alternative modalities of treatment, and potential disability related to the surgery were explained to the patient, who understands and elects to procedure with vasectomy in the office.Plan:1. Follow-up for vasectomyGerald Intisar Claudio, M.D.Assistant Professor of Clinical UrologyYale [phone] for Guerry Bruin, MD by Webb Silversmith, Medical Scribe January 16, 2023The documentation recorded by the scribe accurately reflects the services I personally performed and the decisions made by me. I reviewed and confirmed all material entered and/or pre-charted by the scribe.

## 2021-04-24 ENCOUNTER — Ambulatory Visit: Admit: 2021-04-24 | Payer: PRIVATE HEALTH INSURANCE | Attending: Urology | Primary: Internal Medicine

## 2021-05-06 ENCOUNTER — Telehealth: Admit: 2021-05-06 | Payer: PRIVATE HEALTH INSURANCE | Attending: Urology | Primary: Internal Medicine

## 2021-05-06 NOTE — Telephone Encounter
[facility] MESSAGETime of call:   4:17 PMCaller:  MarkCaller's relationship to patient: PatientReason for call:   Patient is calling to reschedule VASECTOMY procedure that is scheduled for 05/15/2021, patient has a work conflict and will not be able to make it this day. Please advise or reach out to the patient to reschedule procedure.Provider:  Dr Ladell Heads telephone number for callback:   [phone] time to return call:   anytimePermission to leave message:   yesYahaira [facility] Referral Specialist

## 2021-05-07 NOTE — Telephone Encounter
Sent to wrong pool; re-scheduled Vasectomy with Dr. Oneita Hurt.

## 2021-05-08 NOTE — Telephone Encounter
Patient booked for vasectomy 08-28-21.

## 2021-05-15 ENCOUNTER — Ambulatory Visit: Admit: 2021-05-15 | Payer: PRIVATE HEALTH INSURANCE | Attending: Urology | Primary: Internal Medicine

## 2021-06-01 ENCOUNTER — Inpatient Hospital Stay
Admit: 2021-06-01 | Discharge: 2021-06-02 | Payer: PRIVATE HEALTH INSURANCE | Attending: Internal Medicine | Primary: Internal Medicine

## 2021-06-01 ENCOUNTER — Emergency Department: Admit: 2021-06-01 | Payer: PRIVATE HEALTH INSURANCE | Primary: Internal Medicine

## 2021-06-02 ENCOUNTER — Encounter: Admit: 2021-06-02 | Payer: PRIVATE HEALTH INSURANCE | Attending: Medical | Primary: Internal Medicine

## 2021-06-02 DIAGNOSIS — R55 Syncope and collapse: Secondary | ICD-10-CM

## 2021-06-02 DIAGNOSIS — E785 Hyperlipidemia, unspecified: Secondary | ICD-10-CM

## 2021-06-02 DIAGNOSIS — Z8249 Family history of ischemic heart disease and other diseases of the circulatory system: Secondary | ICD-10-CM

## 2021-06-02 DIAGNOSIS — R42 Dizziness and giddiness: Secondary | ICD-10-CM

## 2021-06-02 DIAGNOSIS — R002 Palpitations: Secondary | ICD-10-CM

## 2021-06-02 DIAGNOSIS — Z79899 Other long term (current) drug therapy: Secondary | ICD-10-CM

## 2021-06-02 DIAGNOSIS — Z7982 Long term (current) use of aspirin: Secondary | ICD-10-CM

## 2021-06-02 DIAGNOSIS — I10 Essential (primary) hypertension: Secondary | ICD-10-CM

## 2021-06-02 DIAGNOSIS — Z83438 Family history of other disorder of lipoprotein metabolism and other lipidemia: Secondary | ICD-10-CM

## 2021-06-02 DIAGNOSIS — R001 Bradycardia, unspecified: Secondary | ICD-10-CM

## 2021-06-02 DIAGNOSIS — E038 Other specified hypothyroidism: Secondary | ICD-10-CM

## 2021-06-02 LAB — CBC WITH AUTO DIFFERENTIAL
BKR WAM ABSOLUTE IMMATURE GRANULOCYTES.: 0.01 x 1000/ÂµL (ref 0.00–0.30)
BKR WAM ABSOLUTE IMMATURE GRANULOCYTES.: 0.02 x 1000/ÂµL (ref 0.00–0.30)
BKR WAM ABSOLUTE LYMPHOCYTE COUNT.: 4.39 x 1000/ÂµL — ABNORMAL HIGH (ref 0.60–3.70)
BKR WAM ABSOLUTE LYMPHOCYTE COUNT.: 3.02 x 1000/ÂµL (ref 0.60–3.70)
BKR WAM ABSOLUTE NRBC (2 DEC): 0 x 1000/ÂµL (ref 0.00–1.00)
BKR WAM ABSOLUTE NRBC (2 DEC): 0 x 1000/ÂµL (ref 0.00–1.00)
BKR WAM ANALYZER ANC: 2.61 x 1000/ÂµL (ref 2.00–7.60)
BKR WAM ANALYZER ANC: 2.71 x 1000/ÂµL (ref 2.00–7.60)
BKR WAM BASOPHIL ABSOLUTE COUNT.: 0.03 x 1000/ÂµL (ref 0.00–1.00)
BKR WAM BASOPHIL ABSOLUTE COUNT.: 0.03 x 1000/ÂµL (ref 0.00–1.00)
BKR WAM BASOPHILS: 0.4 % (ref 0.0–1.4)
BKR WAM BASOPHILS: 0.5 % (ref 0.0–1.4)
BKR WAM EOSINOPHIL ABSOLUTE COUNT.: 0.12 x 1000/ÂµL (ref 0.00–1.00)
BKR WAM EOSINOPHIL ABSOLUTE COUNT.: 0.12 x 1000/ÂµL (ref 0.00–1.00)
BKR WAM EOSINOPHILS: 1.5 % (ref 0.0–5.0)
BKR WAM EOSINOPHILS: 1.9 % (ref 0.0–5.0)
BKR WAM HEMATOCRIT (2 DEC): 36.9 % — ABNORMAL LOW (ref 38.50–50.00)
BKR WAM HEMATOCRIT (2 DEC): 37.9 % — ABNORMAL LOW (ref 38.50–50.00)
BKR WAM HEMOGLOBIN: 12.4 g/dL — ABNORMAL LOW (ref 13.2–17.1)
BKR WAM HEMOGLOBIN: 12.7 g/dL — ABNORMAL LOW (ref 13.2–17.1)
BKR WAM IMMATURE GRANULOCYTES: 0.1 % (ref 0.0–1.0)
BKR WAM IMMATURE GRANULOCYTES: 0.3 % (ref 0.0–1.0)
BKR WAM LYMPHOCYTES: 56 % — ABNORMAL HIGH (ref 17.0–50.0)
BKR WAM LYMPHOCYTES: 46.8 % (ref 17.0–50.0)
BKR WAM MCH (PG): 31.3 pg (ref 27.0–33.0)
BKR WAM MCH (PG): 31.3 pg (ref 27.0–33.0)
BKR WAM MCHC: 33.6 g/dL (ref 31.0–36.0)
BKR WAM MCHC: 33.5 g/dL (ref 31.0–36.0)
BKR WAM MCV: 93.2 fL (ref 80.0–100.0)
BKR WAM MCV: 93.3 fL (ref 80.0–100.0)
BKR WAM MONOCYTE ABSOLUTE COUNT.: 0.68 x 1000/ÂµL (ref 0.00–1.00)
BKR WAM MONOCYTE ABSOLUTE COUNT.: 0.55 x 1000/ÂµL (ref 0.00–1.00)
BKR WAM MONOCYTES: 8.7 % (ref 4.0–12.0)
BKR WAM MONOCYTES: 8.5 % (ref 4.0–12.0)
BKR WAM MPV: 10.6 fL (ref 8.0–12.0)
BKR WAM MPV: 11 fL (ref 8.0–12.0)
BKR WAM NEUTROPHILS: 33.3 % — ABNORMAL LOW (ref 39.0–72.0)
BKR WAM NEUTROPHILS: 42 % (ref 39.0–72.0)
BKR WAM NUCLEATED RED BLOOD CELLS: 0 % (ref 0.0–1.0)
BKR WAM NUCLEATED RED BLOOD CELLS: 0 % (ref 0.0–1.0)
BKR WAM PLATELETS: 259 x1000/ÂµL (ref 150–420)
BKR WAM PLATELETS: 273 x1000/ÂµL (ref 150–420)
BKR WAM RDW-CV: 12.8 % (ref 11.0–15.0)
BKR WAM RDW-CV: 12.7 % (ref 11.0–15.0)
BKR WAM RED BLOOD CELL COUNT.: 3.96 M/ÂµL — ABNORMAL LOW (ref 4.00–6.00)
BKR WAM RED BLOOD CELL COUNT.: 4.06 M/ÂµL (ref 4.00–6.00)
BKR WAM WHITE BLOOD CELL COUNT: 7.8 x1000/ÂµL (ref 4.0–11.0)
BKR WAM WHITE BLOOD CELL COUNT: 6.5 x1000/ÂµL (ref 4.0–11.0)

## 2021-06-02 LAB — COMPREHENSIVE METABOLIC PANEL
BKR A/G RATIO: 1.4
BKR ALANINE AMINOTRANSFERASE (ALT): 43 U/L (ref 12–78)
BKR ALBUMIN: 4.2 g/dL (ref 3.4–5.0)
BKR ALKALINE PHOSPHATASE: 82 U/L (ref 20–135)
BKR ANION GAP: 7 (ref 5–18)
BKR ASPARTATE AMINOTRANSFERASE (AST): 27 U/L (ref 5–37)
BKR AST/ALT RATIO: 0.6
BKR BILIRUBIN TOTAL: 1 mg/dL (ref 0.0–1.0)
BKR BLOOD UREA NITROGEN: 17 mg/dL (ref 8–25)
BKR BUN / CREAT RATIO: 17.5 (ref 8.0–25.0)
BKR CALCIUM: 9.5 mg/dL (ref 8.8–10.2)
BKR CHLORIDE: 107 mmol/L (ref 95–115)
BKR CO2: 28 mmol/L (ref 21–32)
BKR CREATININE: 0.97 mg/dL (ref 0.50–1.30)
BKR EGFR, CREATININE (CKD-EPI 2021): >60 mL/min/{1.73_m2} (ref >=60)
BKR GLOBULIN: 3.1 g/dL
BKR GLUCOSE: 94 mg/dL (ref 70–100)
BKR OSMOLALITY CALCULATION: 284 mosm/kg (ref 275–295)
BKR POTASSIUM: 3.8 mmol/L (ref 3.5–5.1)
BKR PROTEIN TOTAL: 7.3 g/dL (ref 6.4–8.2)
BKR SODIUM: 142 mmol/L (ref 136–145)

## 2021-06-02 LAB — LYME ANTIBODIES W/RFLX TO CONFIRM (MODIFIED TWO-TIER TESTING): BKR LYME ANTIBODY: 0.11 (ref 0.00–0.89)

## 2021-06-02 LAB — TROPONIN T HIGH SENSITIVITY, 0 HOUR BASELINE WITH REFLEX (BH GH LMW YH): BKR TROPONIN T HS 0 HOUR BASELINE: 11 ng/L

## 2021-06-02 LAB — BASIC METABOLIC PANEL
BKR ANION GAP: 7 (ref 5–18)
BKR BLOOD UREA NITROGEN: 14 mg/dL (ref 8–25)
BKR BUN / CREAT RATIO: 14.3 (ref 8.0–25.0)
BKR CALCIUM: 9.4 mg/dL (ref 8.8–10.2)
BKR CHLORIDE: 106 mmol/L (ref 95–115)
BKR CO2: 28 mmol/L (ref 21–32)
BKR CREATININE: 0.98 mg/dL (ref 0.50–1.30)
BKR EGFR, CREATININE (CKD-EPI 2021): >60 mL/min/{1.73_m2} (ref >=60)
BKR GLUCOSE: 103 mg/dL — ABNORMAL HIGH (ref 70–100)
BKR OSMOLALITY CALCULATION: 282 mosm/kg (ref 275–295)
BKR POTASSIUM: 4.1 mmol/L (ref 3.5–5.1)
BKR SODIUM: 141 mmol/L (ref 136–145)

## 2021-06-02 LAB — T4, FREE: BKR FREE T4: 1.13 ng/dL (ref 0.76–1.46)

## 2021-06-02 LAB — TSH W/REFLEX TO FT4     (BH GH LMW Q YH): BKR THYROID STIMULATING HORMONE: 4.06 u[IU]/mL — ABNORMAL HIGH (ref 0.358–3.740)

## 2021-06-02 LAB — TROPONIN T HIGH SENSITIVITY, 1 HOUR WITH REFLEX (BH GH LMW YH)
BKR TROPONIN T HS 1 HOUR DELTA FROM 0 HOUR: -1 ng/L
BKR TROPONIN T HS 1 HOUR: 10 ng/L

## 2021-06-02 LAB — MAGNESIUM: BKR MAGNESIUM: 1.9 mg/dL (ref 1.4–2.2)

## 2021-06-02 MED ORDER — ONDANSETRON HCL (PF) 4 MG/2 ML INJECTION SOLUTION
42 mg/2 mL | Freq: Four times a day (QID) | INTRAVENOUS | Status: DC | PRN
Start: 2021-06-02 — End: 2021-06-02

## 2021-06-02 MED ORDER — MELATONIN 3 MG TABLET
3 mg | Freq: Every evening | ORAL | Status: DC | PRN
Start: 2021-06-02 — End: 2021-06-02

## 2021-06-02 MED ORDER — LISINOPRIL 20 MG TABLET
20 mg | Freq: Every day | ORAL | Status: DC
Start: 2021-06-02 — End: 2021-06-02
  Administered 2021-06-02: 13:00:00 20 mg via ORAL

## 2021-06-02 MED ORDER — SODIUM CHLORIDE 0.9 % (FLUSH) INJECTION SYRINGE
0.9 % | Freq: Three times a day (TID) | INTRAVENOUS | Status: DC
Start: 2021-06-02 — End: 2021-06-02
  Administered 2021-06-02: 10:00:00 0.9 mL via INTRAVENOUS

## 2021-06-02 MED ORDER — ASPIRIN 81 MG TABLET,DELAYED RELEASE
81 mg | Freq: Every day | ORAL | Status: DC
Start: 2021-06-02 — End: 2021-06-02
  Administered 2021-06-02: 13:00:00 81 mg via ORAL

## 2021-06-02 MED ORDER — ROSUVASTATIN 10 MG TABLET
10 mg | Freq: Every day | ORAL | Status: DC
Start: 2021-06-02 — End: 2021-06-02
  Administered 2021-06-02: 13:00:00 10 mg via ORAL

## 2021-06-02 MED ORDER — ONDANSETRON 4 MG DISINTEGRATING TABLET
4 mg | Freq: Four times a day (QID) | ORAL | Status: DC | PRN
Start: 2021-06-02 — End: 2021-06-02

## 2021-06-02 MED ORDER — ACETAMINOPHEN 325 MG TABLET
325 mg | Freq: Three times a day (TID) | ORAL | Status: DC | PRN
Start: 2021-06-02 — End: 2021-06-02

## 2021-06-02 MED ORDER — SODIUM CHLORIDE 0.9 % (FLUSH) INJECTION SYRINGE
0.9 % | INTRAVENOUS | Status: DC | PRN
Start: 2021-06-02 — End: 2021-06-02
  Administered 2021-06-02: 13:00:00 0.9 mL via INTRAVENOUS

## 2021-06-02 MED ORDER — ENOXAPARIN 40 MG/0.4 ML SUBCUTANEOUS SYRINGE
400.4 mg/0.4 mL | SUBCUTANEOUS | Status: DC
Start: 2021-06-02 — End: 2021-06-02

## 2021-06-02 NOTE — Plan of Care
Plan of Care Overview/ Patient Status    Deanna Boehlke was discharged via Private Car accompanied by Alone (pt drove self here, stable and well to drive self home).  Verbalized understanding of discharge instructionsand recommended follow up care as per the after visit summary.  Written discharge instructions provided. Denies any further questions. Vital signs    Vitals:  06/02/21 0152 06/02/21 0235 06/02/21 0300 06/02/21 0744 BP: (!) 149/74 (!) 151/83  (!) 157/89 Pulse: (!) 57 (!) 53  60 Resp: 16 20  20  Temp:  97.9 ?F (36.6 ?C)  97.6 ?F (36.4 ?C) TempSrc:  Oral  Oral SpO2: 97% 100%  97% Weight:  108.4 kg 108.4 kg  Height:  6' 4 (1.93 m)   Patient confirmed all belongings returned. IV removed. Care plan and education complete. Belongings charted in last 7 days: Valuable(s) : Vision; Jewelry; Holiday representative; Money; Wallet; Keys; Other (Comments) (06/02/2021  3:00 AM)

## 2021-06-02 NOTE — ED Provider Notes
HistoryChief Complaint Patient presents with ? Palpitations   Experiencing palpitations and HR in the 40's today. Feeling lightheaded at time.  Denies any nausea or chest pain.  Denies any sob.,  Austin Farley is a 36 year old male with past medical history of hypertension, hyperlipidemia who presents with palpitations, lightheadedness and a low heart rate.  Patient reports he has been in good health.  He recently lost between 10 and 15 pounds and saw his primary care physician a week ago.  This evening he began to feel lightheaded and had the sensation that he could pass out.  He also had some palpitations which he occasionally gets but this lasted for longer than usual.  He checked his heart rate on his exercise watch another electronic device and then manually checked his pulse and it was in the 40s.  He denies any chest pain, shortness of breath, recent bites or rashes, fever, chills.  He traveled to [facility] a few weeks ago.  He has an extensive family history for cardiac disease.  Patient has had a stress test, a cardiac [place] and has worn a Holter monitor in the past. Past Medical History: Diagnosis Date ? HLD (hyperlipidemia)  ? HTN (hypertension)  No past surgical history on file.Family History Problem Relation Age of Onset ? Hypertension Mother  ? High cholesterol Mother  ? High cholesterol Father  ? Hypertension Father  Social History Socioeconomic History ? Marital status: Unknown Tobacco Use ? Smoking status: Never Substance and Sexual Activity ? Alcohol use: Yes   Alcohol/week: 1.0 standard drink   Types: 1 Glasses of wine per week   Comment: daily ? Drug use: Never ED Other Social History E-cigarette/Vaping Substances E-cigarette/Vaping Devices Review of Systems Constitutional: Negative for activity change, appetite change, chills, diaphoresis, fatigue and fever. Respiratory: Negative for cough, chest tightness, shortness of breath and wheezing.  Cardiovascular: Positive for palpitations. Negative for chest pain and leg swelling. Gastrointestinal: Negative for abdominal pain, diarrhea, nausea and vomiting. Skin: Negative for color change, pallor, rash and wound. Neurological: Positive for dizziness and light-headedness. Negative for syncope and weakness. Hematological: Negative for adenopathy. Does not bruise/bleed easily.  Physical ExamED Triage VitalsBP: (!) 178/99 [06/01/21 2140]Pulse: 60 [06/01/21 2140]Pulse from  O2 sat: 60 [06/01/21 2226]Resp: 19 [06/01/21 2140]Temp: 98.4 ?F (36.9 ?C) [06/01/21 2140]Temp src: n/aSpO2: 100 % [06/01/21 2140]BP (!) 151/83  - Pulse (!) 53  - Temp 97.9 ?F (36.6 ?C) (Oral)  - Resp 20  - Ht 6' 4 (1.93 m)  - Wt 108.4 kg  - SpO2 100%  - BMI 29.09 kg/m? Physical ExamVitals and nursing note reviewed. Constitutional:     General: He is not in acute distress.   Appearance: Normal appearance. He is normal weight. He is not ill-appearing. Cardiovascular:    Rate and Rhythm: Bradycardia present.    Pulses: Normal pulses.    Heart sounds: Normal heart sounds. Pulmonary:    Effort: Pulmonary effort is normal.    Breath sounds: Normal breath sounds. Abdominal:    General: Abdomen is flat. Bowel sounds are normal.    Palpations: Abdomen is soft. Musculoskeletal:       General: Normal range of motion. Skin:   General: Skin is warm.    Capillary Refill: Capillary refill takes less than 2 seconds. Neurological:    Mental Status: He is alert and oriented to person, place, and time. Laboratory studies: Results for orders placed or performed during the hospital encounter of 06/01/21 TSH w/reflex to FT4 Result Value Ref Range  Thyroid  Stimulating Hormone 4.060 (H) 0.358 - 3.740 ?IU/mL Troponin T High Sensitivity, Emergency; 0 hour baseline AND 1 hour with reflex (3 hour) Result Value Ref Range  High Sensitivity Troponin T 11 See Comment ng/L CBC auto differential Result Value Ref Range  WBC 7.8 4.0 - 11.0 x1000/?L  RBC 3.96 (L) 4.00 - 6.00 M/?L  Hemoglobin 12.4 (L) 13.2 - 17.1 g/dL  Hematocrit 16.10 (L) 96.04 - 50.00 %  MCV 93.2 80.0 - 100.0 fL  MCH 31.3 27.0 - 33.0 pg  MCHC 33.6 31.0 - 36.0 g/dL  RDW-CV 54.0 98.1 - 19.1 %  Platelets 259 150 - 420 x1000/?L  MPV 10.6 8.0 - 12.0 fL  Neutrophils 33.3 (L) 39.0 - 72.0 %  Lymphocytes 56.0 (H) 17.0 - 50.0 %  Monocytes 8.7 4.0 - 12.0 %  Eosinophils 1.5 0.0 - 5.0 %  Basophil 0.4 0.0 - 1.4 %  Immature Granulocytes 0.1 0.0 - 1.0 %  nRBC 0.0 0.0 - 1.0 %  ANC(Abs Neutrophil Count) 2.61 2.00 - 7.60 x 1000/?L  Absolute Lymphocyte Count 4.39 (H) 0.60 - 3.70 x 1000/?L  Monocyte Absolute Count 0.68 0.00 - 1.00 x 1000/?L  Eosinophil Absolute Count 0.12 0.00 - 1.00 x 1000/?L  Basophil Absolute Count 0.03 0.00 - 1.00 x 1000/?L  Absolute Immature Granulocyte Count 0.01 0.00 - 0.30 x 1000/?L  Absolute nRBC 0.00 0.00 - 1.00 x 1000/?L Comprehensive metabolic panel Result Value Ref Range  Sodium 142 136 - 145 mmol/L  Potassium 3.8 3.5 - 5.1 mmol/L  Chloride 107 95 - 115 mmol/L  CO2 28 21 - 32 mmol/L  Anion Gap 7 5 - 18  Glucose 94 70 - 100 mg/dL  BUN 17 8 - 25 mg/dL  Creatinine 4.78 2.95 - 1.30 mg/dL  Calcium 9.5 8.8 - 62.1 mg/dL  BUN/Creatinine Ratio 30.8 8.0 - 25.0  Total Protein 7.3 6.4 - 8.2 g/dL  Albumin 4.2 3.4 - 5.0 g/dL  Total Bilirubin 1.0 0.0 - 1.0 mg/dL  Alkaline Phosphatase 82 20 - 135 U/L  Alanine Aminotransferase (ALT) 43 12 - 78 U/L  Aspartate Aminotransferase (AST) 27 5 - 37 U/L  Globulin 3.1 g/dL  A/G Ratio 1.4   AST/ALT Ratio 0.6 See Comment  Osmolality Calculation 284 275 - 295 mOsm/kg  eGFR (Creatinine) >60 >=60 mL/min/1.34m2 Troponin T High Sensitivity, 1 Hour With Reflex (BH GH LMW YH) Result Value Ref Range  High Sensitivity Troponin T 10 See Comment ng/L  1 hour Delta from 0 Hour, HS-Troponin T -1 ng/L T4, free Result Value Ref Range  Free T4 1.13 0.76 - 1.46 ng/dL Magnesium Result Value Ref Range  Magnesium 1.9 1.4 - 2.2 mg/dL EKG Result Value Ref Range  Heart Rate 56 bpm  QRS Interval 98 ms  QT Interval 434 ms  QTC Interval 418 ms  P Axis 2 deg  QRS Axis 42 deg  T Wave Axis 30 deg  P-R Interval 160 msec  SEVERITY Otherwise Normal ECG severity Radiological Imaging Studies:CXR (portable)   negative Summary of ED Vital Signs: Vitals:  06/01/21 2320 06/02/21 0152 06/02/21 0235 06/02/21 0300 BP: (!) 145/80 (!) 149/74 (!) 151/83  Pulse: (!) 53 (!) 57 (!) 53  Resp: 17 16 20   Temp:   97.9 ?F (36.6 ?C)  TempSrc:   Oral  SpO2: 99% 97% 100%  Weight:   108.4 kg 108.4 kg Height:   6' 4 (1.93 m)   Medical Decision Making45 year old male presents with palpitations, lightheadedness and a slow heart rate.Patient's EKG on  arrival was sinus bradycardiaDifferential considered hypothyroidism, electrolyte abnormality, Lyme disease, vasovagalPatient's heart rate was in the 50s on the telemetry monitor.  His labs are unremarkable including high sensitivity troponins which are 10 and 11.Will admit him for cardiac monitoring because he was symptomatic when his heart rate was in the 40s.Near syncope: acute illness or injuryPalpitations: acute illness or injurySymptomatic bradycardia: acute illness or injuryAmount and/or Complexity of Data ReviewedLabs: ordered. Decision-making details documented in ED Course.Radiology: ordered and independent interpretation performed.ECG/medicine tests: ordered.Clinical Impressions as of 06/02/21 0339 Symptomatic bradycardia Near syncope Palpitations ED Disposition:Observation Christell Constant, PA05/23/23 0981

## 2021-06-02 NOTE — Plan of Care
Admission Note Nursing Austin Farley is a 36 y.o. male admitted with a chief complaint of low pulse, lightheadedness and palpitations; Patient arrived from ED to Med A floor via stretcher;  Patient is  alert & oriented x4; denies pain, palpitations; lightheadedness; dizziness, chest pain,  difficulties breathing, nausea or any other discomforts;  Pt. Able to self transfer from stretcher stand by assist of 1; no difficulties walking or balance/gait issues; VSS Burnis Kingfisher; RTM box & leads intact; IV peripheral line to RUE patent; Pt. expressed desire to be discharged home as early as possible if medically stable; pt noted continent of bladder voided assisted to the bathroom.   Vitals:  06/01/21 2320 06/02/21 0152 06/02/21 0235 06/02/21 0300 BP: (!) 145/80 (!) 149/74 (!) 151/83  Pulse: (!) 53 (!) 57 (!) 53  Resp: 17 16 20   Temp:   97.9 ?F (36.6 ?C)  TempSrc:   Oral  SpO2: 99% 97% 100%  Weight:   108.4 kg 108.4 kg Height:   6' 4 (1.93 m)  Oxygen therapy Oxygen TherapySpO2: 100 %Device (Oxygen Therapy): room airI have reviewed the patient's current medication orders..I have reviewed patient valuables Belongings charted in last 7 days: Valuable(s) : Vision; Jewelry; Holiday representative; Money; Wallet; Keys; Other (Comments) (06/02/2021  3:00 AM) See flowsheets, patient education and plan of care for additional information. Plan of Care Overview/ Patient Status

## 2021-06-02 NOTE — Plan of Care
Plan of Care Overview/ Patient Status    Assumed care at 1020. Pt a&ox4. Independent, steady gait. Bed alarm refused. Call bell in reach

## 2021-06-02 NOTE — Plan of Care
Plan of Care Overview/ Patient Status    Assumed care0700. A&Ox4, anxius to DC. Hypertensive 157/89, lisinopril as scheduled. No c/o pain. Meds whole, cardiac diet. OOB ind. Safety maintained, will continue with plan of care.1020: handoff given to [employer]

## 2021-06-02 NOTE — Discharge Summary
[facility] SummaryPatient Data:  Patient Name: Austin Farley Admit date: 06/01/2021 Age: 36 y.o. Discharge date: 06/02/2021 DOB: 1985/03/30	 Discharge Attending Physician: Marshell Levan, MD  MRN: [id number]	 Discharged Condition: good/stable PCP: Erskin Burnet, MD Disposition: Home  Principal Diagnosis: palpitationsSecondary Diagnoses: HTN, bradycardiaIssues to be Addressed Post Discharge: Pending Labs and Tests: Follow-up Information:Gharekhan, Ludwig Clarks, MD Future Appointments Date Time Provider Department Center 08/28/2021 11:00 AM Guerry Bruin, MD [facility] UROLOGY [facility] Hospital Course: Hospital Course: Patient is a 36 yo male PMH HTN, HLD presents to ED c/o palpitations. Pt states it is not uncommon for him to have palpitations. He has had holter monitor in past ( did not reveal anything) .  Stress test and echo in past 6 months that have been unremarkable. Pt states what made him come to ED is that he felt some pronounced heart beats  his HR was in 40s and he was feeling dizzy H. This lasted for about 30 minutes.   He did not fall or lose consciousness.  No new meds or OTC's.  - stable overnight, HR 50-60 - discussed with Dr Liliane Shi who ill arrange for another day Holter; ? ectopic beats can sometimes be the reason HR reading looks too low as weak contractions may not be registered - resume his usual lisinopril and statin - if 5 day holter again does not reveal anything a personal FDA approved ECG device such as 2 lead ALive Cor could be helpful to record the vents in real time when/if his symptoms recur Pertinent lab findings and test results: Objective: Recent Labs Lab 05/22/232226 05/23/230620 WBC 7.8 6.5 HGB 12.4* 12.7* HCT 36.90* 37.90* PLT 259 273  Recent Labs Lab 05/22/232226 05/23/230620 NEUTROPHILS 33.3* 42.0  Recent Labs Lab 05/22/232226 05/23/230620 NA 142 141 K 3.8 4.1 CL 107 106 CO2 28 28 BUN 17 14 CREATININE 0.97 0.98 GLU 94 103* ANIONGAP 7 7  Recent Labs Lab 05/22/232226 05/23/230620 CALCIUM 9.5 9.4 MG 1.9  --   Recent Labs Lab 05/22/232226 ALT 43 AST 27 ALKPHOS 82 BILITOT 1.0  No results for input(s): PTT, LABPROT, INR in the last 168 hours. Culture Information:No results for input(s): LABBLOO, LABURIN, LOWERRESPIRA in the last 168 hours.Imaging: Imaging results last 1 week:  CXR (portable)Result Date: 5/23/2023Limited AP view- No acute abnormality. [facility] Radiology Notify System Classification: Routine Reported and Signed by:  Lonia Chimera, MD Diet:  Heart healthy dietMobility: Highest Level of mobility - ACTUAL: Mobility Level 8, Walk 250+ feet AM PAC 24  Physical Exam Discharge vitals: Temp:  [97.6 ?F (36.4 ?C)-98.4 ?F (36.9 ?C)] 97.6 ?F (36.4 ?C)Pulse:  [53-60] 60Resp:  [16-20] 20BP: (145-178)/(74-99) 157/89SpO2:  [97 %-100 %] 97 %Device (Oxygen Therapy): room air Discharge Physical Exam:Physical Exam pleasant 36 yo male,  NADEyes: PERRLA, EOMI, anicteric sclera, conjunctiva pinkENMT:  OP clear w/o exudate, MM dry, no oral soresNeck: supple no JVD, no thyromegaly, no carotid bruitsCV: RRR, no m/r/gRespiratory:CTA b/l GI: soft NT/ND nabs, no HSM, Extremities: no c/c/eMSK: normal ROM, no spinal or paraspinal tendernessNeurologic: AOx3, nl speech, CN II-XII intact, 5/5 strength throughout, nl sensation, no focal deficits Heme/Lymph: no lymphadenopathy apprecitatedPsychiatric: normal mood and affectSkin: no rash or lesionsAllergies No Known Allergies PMH PSH Past Medical History: Diagnosis Date ? HLD (hyperlipidemia)  ? HTN (hypertension)   No past surgical history on file. Social History Family History Social History Tobacco Use ? Smoking status: Never ? Smokeless tobacco: Not on file Substance Use Topics ? Alcohol use: Yes   Alcohol/week: 1.0 standard drink  Types: 1 Glasses of wine per week   Comment: daily  Family History Problem Relation Age of Onset ? Hypertension Mother  ? High cholesterol Mother  ? High cholesterol Father  ? Hypertension Father   Discharge Medications: Discharge: Current Discharge Medication List  CONTINUE these medications which have NOT CHANGED  Details aspirin 81 mg EC delayed release tablet Take 1 tablet (81 mg total) by mouth daily.  atorvastatin (LIPITOR) 20 mg tablet TAKE 1 TABLET BY MOUTH EVERY DAY  lisinopriL (PRINIVIL,ZESTRIL) 40 mg tablet lisinopril 40 mg tablet TAKE 1 TABLET BY MOUTH EVERY DAY   There was at total of approximately 35 minutes spent on the discharge of this patientElectronically Signed:[id number] Dale Strausser, MD 06/02/2021 12:26 PMBest Contact Information:

## 2021-06-02 NOTE — Discharge Instructions
-   resume your usual  home medications - call Dr Alphonsa Overall office; he put the order in for you to be hooked up on a 5 day Holter monitor  - in case 5 days of Holter is unrevealing again of any arrhythmias that coincide with your symptoms an FDA ECG device such as 2 lead AliveCor [employer] personal ECG ( works with your cell) could be used to record events in real time when you are experiencing palpitations and dizzinessGOOD LUCK and KEEP SAFEDr [employer]

## 2021-06-02 NOTE — ED Notes
2:04 AM Report given to [facility] RN, RTM box placement confirmed. Transport requested.

## 2021-06-02 NOTE — H&P
[facility]	Medicine Hospitalist Attending History & PhysicalHistory provided by: the patientHistory limited by: no limitationsSubjective: Chief Complaint: palpitations HPI: Patient is a 36 yo male PMH HTN, HLD presents to ED c/o palpitations. Pt states it is not uncommon for him to have palpitations. He has had event monitor in past.  Stress test and echo in past 6 months that have been unremarkable. Pt states what made him come to ED was he noticed his HR was in 40s when he did not feel well.  He felt as if he would pass out.  He did not fall or lose consciousness.  No f/c, cough.  No abd pain. No BRBPR or melena. Denies dysuria or increased urinary freq.  No sob, orthopnea or PND. No leg pain or swelling. No other conplaints ED course: no meds given  Medical History: PMH PSH Past Medical History: Diagnosis Date ? HLD (hyperlipidemia)  ? HTN (hypertension)   No past surgical history on file. Social History Family History Social History Socioeconomic History ? Marital status: Unknown   Spouse name: Not on file ? Number of children: Not on file ? Years of education: Not on file ? Highest education level: Not on file Occupational History ? Not on file Tobacco Use ? Smoking status: Never ? Smokeless tobacco: Not on file Substance and Sexual Activity ? Alcohol use: Yes   Alcohol/week: 1.0 standard drink   Types: 1 Glasses of wine per week   Comment: daily ? Drug use: Never ? Sexual activity: Not on file Other Topics Concern ? Not on file Social History Narrative ? Not on file Social Determinants of Health Financial Resource Strain: Low Risk  ? Difficulty of Paying Living Expenses: Not hard at all Food Insecurity: No Food Insecurity ? Worried About Programme researcher, broadcasting/film/video in the Last Year: Never true ? Ran Out of Food in the Last Year: Never true Transportation Needs: No Transportation Needs ? Lack of Transportation (Medical): No ? Lack of Transportation (Non-Medical): No Physical Activity: Not on file Stress: Not on file Social Connections: Not on file Intimate Partner Violence: Not on file Housing Stability: Low Risk  ? Housing Stability: I have a steady place to live  Family History Problem Relation Age of Onset ? Hypertension Mother  ? High cholesterol Mother  ? High cholesterol Father  ? Hypertension Father   Prior to Admission Medications Current Facility-Administered Medications Medication ? acetaminophen (TYLENOL) tablet 650 mg ? aspirin EC delayed release tablet 81 mg ? enoxaparin (LOVENOX) syringe 40 mg ? lisinopriL (PRINIVIL,ZESTRIL) tablet 40 mg ? melatonin tablet 3 mg ? ondansetron (ZOFRAN-ODT) disintegrating tablet 4 mg  Or ? ondansetron (PF) (ZOFRAN) injection 4 mg ? rosuvastatin (CRESTOR) tablet 10 mg ? sodium chloride 0.9 % flush 3 mL ? sodium chloride 0.9 % flush 3 mL  Home medication list obtained from: patient Allergies No Known Allergies Review of Systems: Review of Systems:All 10 point review of systems reviewed by me and are either negative or noncontributory except what is documented in HPIObjective: Physical Exam: Vitals: BP (!) 151/83  - Pulse (!) 53  - Temp 97.9 ?F (36.6 ?C) (Oral)  - Resp 20  - Ht 6' 4 (1.93 m)  - Wt 108.4 kg  - SpO2 100%  - BMI 29.09 kg/m? Constitutional: pleasant 36 yo male sitting up in NADEyes: PERRLA, EOMI, anicteric sclera, conjunctiva pinkENMT:  OP clear w/o exudate, MM dry, no oral soresNeck: supple no JVD, no thyromegaly, no carotid bruitsCV: RRR, no m/r/gRespiratory:CTA b/l GI: soft NT/ND  nabs, no HSM, Extremities: no c/c/eMSK: normal ROM, no spinal or paraspinal tendernessNeurologic: AOx3, nl speech, CN II-XII intact, 5/5 strength throughout, nl sensation, no focal deficits Heme/Lymph: no lymphadenopathy apprecitatedPsychiatric: normal mood and affectSkin: no rash or lesionsPertinent Labs/Diagnostics: Labs: Last 24 hours: Recent Results (from the past 24 hour(s)) EKG  Collection Time: 06/01/21  9:40 PM Result Value Ref Range  Heart Rate 56 bpm  QRS Interval 98 ms  QT Interval 434 ms  QTC Interval 418 ms  P Axis 2 deg  QRS Axis 42 deg  T Wave Axis 30 deg  P-R Interval 160 msec  SEVERITY Otherwise Normal ECG severity TSH w/reflex to FT4  Collection Time: 06/01/21 10:26 PM Result Value Ref Range  Thyroid Stimulating Hormone 4.060 (H) 0.358 - 3.740 ?IU/mL Troponin T High Sensitivity, Emergency; 0 hour baseline AND 1 hour with reflex (3 hour)  Collection Time: 06/01/21 10:26 PM Result Value Ref Range  High Sensitivity Troponin T 11 See Comment ng/L CBC auto differential  Collection Time: 06/01/21 10:26 PM Result Value Ref Range  WBC 7.8 4.0 - 11.0 x1000/?L  RBC 3.96 (L) 4.00 - 6.00 M/?L  Hemoglobin 12.4 (L) 13.2 - 17.1 g/dL  Hematocrit 16.10 (L) 96.04 - 50.00 %  MCV 93.2 80.0 - 100.0 fL  MCH 31.3 27.0 - 33.0 pg  MCHC 33.6 31.0 - 36.0 g/dL  RDW-CV 54.0 98.1 - 19.1 %  Platelets 259 150 - 420 x1000/?L  MPV 10.6 8.0 - 12.0 fL  Neutrophils 33.3 (L) 39.0 - 72.0 %  Lymphocytes 56.0 (H) 17.0 - 50.0 %  Monocytes 8.7 4.0 - 12.0 %  Eosinophils 1.5 0.0 - 5.0 %  Basophil 0.4 0.0 - 1.4 %  Immature Granulocytes 0.1 0.0 - 1.0 %  nRBC 0.0 0.0 - 1.0 %  ANC(Abs Neutrophil Count) 2.61 2.00 - 7.60 x 1000/?L  Absolute Lymphocyte Count 4.39 (H) 0.60 - 3.70 x 1000/?L  Monocyte Absolute Count 0.68 0.00 - 1.00 x 1000/?L  Eosinophil Absolute Count 0.12 0.00 - 1.00 x 1000/?L  Basophil Absolute Count 0.03 0.00 - 1.00 x 1000/?L  Absolute Immature Granulocyte Count 0.01 0.00 - 0.30 x 1000/?L  Absolute nRBC 0.00 0.00 - 1.00 x 1000/?L Comprehensive metabolic panel  Collection Time: 06/01/21 10:26 PM Result Value Ref Range  Sodium 142 136 - 145 mmol/L  Potassium 3.8 3.5 - 5.1 mmol/L  Chloride 107 95 - 115 mmol/L  CO2 28 21 - 32 mmol/L  Anion Gap 7 5 - 18  Glucose 94 70 - 100 mg/dL  BUN 17 8 - 25 mg/dL  Creatinine 4.78 2.95 - 1.30 mg/dL  Calcium 9.5 8.8 - 62.1 mg/dL  BUN/Creatinine Ratio 30.8 8.0 - 25.0  Total Protein 7.3 6.4 - 8.2 g/dL  Albumin 4.2 3.4 - 5.0 g/dL  Total Bilirubin 1.0 0.0 - 1.0 mg/dL  Alkaline Phosphatase 82 20 - 135 U/L  Alanine Aminotransferase (ALT) 43 12 - 78 U/L  Aspartate Aminotransferase (AST) 27 5 - 37 U/L  Globulin 3.1 g/dL  A/G Ratio 1.4   AST/ALT Ratio 0.6 See Comment  Osmolality Calculation 284 275 - 295 mOsm/kg  eGFR (Creatinine) >60 >=60 mL/min/1.64m2 T4, free  Collection Time: 06/01/21 10:26 PM Result Value Ref Range  Free T4 1.13 0.76 - 1.46 ng/dL Magnesium  Collection Time: 06/01/21 10:26 PM Result Value Ref Range  Magnesium 1.9 1.4 - 2.2 mg/dL Troponin T High Sensitivity, 1 Hour With Reflex (BH GH LMW YH)  Collection Time: 06/01/21 11:18 PM Result Value Ref Range  High  Sensitivity Troponin T 10 See Comment ng/L  1 hour Delta from 0 Hour, HS-Troponin T -1 ng/L Diagnostics:CXR (portable) ED Interpretation negative  CARDIAC EKG RESULT SCAN Final Result  ECG/Tele Events: Sinus brady in 50s, no ST-T changes Assessment: 36 yo male PMH HTN, HLD a/w symptomatic bradycardia for further w/u. Plan: 1. Symptomatic bradycardia:-Admit to monitored bed-consult patient's cardiologist, Dr. Janna Arch, [facility]-Treadmill stress test 02/2021 wnl-Cardiac calcium scoring [place] scan was zero and completed 03/2021-event holter monitoring done in past as well, year not remembered by patient. -TSH is slightly elevated but Free T4 nl. -echo in am2. Subclinical hypothyroidism:-Free t4 normal-pt informed.  Monitor as outpatient with PCP. -doubt related to bradycardia but monitor3. HTN: -cont lisinopril--4. Med rec; complete ---FEN: cardiacPPx:lovenoxCODE: Full Notifications: PCP: Erskin Burnet    Primary Care Provider was notified of this admission. NoFamily was notified of this admission. YesPlan discussed with patient and/or family. YesSigned:Duron Meister Clarke-Leconte MD, Pager [id number]/[id number] AM

## 2021-06-02 NOTE — ED Notes
1:01 AM Report given to [facility].

## 2021-06-02 NOTE — ED Notes
11:54 PM SBAR HandoffSituation:	Admitting Diagnosis: The primary encounter diagnosis was Symptomatic bradycardia. Diagnoses of Near syncope and Palpitations were also pertinent to this visit.Background:  Chief Complaint Patient presents with ? Palpitations   Experiencing palpitations and HR in the 40's today. Feeling lightheaded at time.  Denies any nausea or chest pain.  Denies any sob., Isolation status: Universal precautionsCOVID test from today is not indicatedAllergies:Allergies as of 06/01/2021 ? (No Known Allergies) Code status: FULL CODEAssessment:Vital signs:	Vitals:  06/01/21 2140 06/01/21 2226 06/01/21 2320 BP: (!) 178/99 (!) 161/94 (!) 145/80 Pulse: 60 (!) 59 (!) 53 Resp: 19  17 Temp: 98.4 ?F (36.9 ?C)   SpO2: 100% 100% 99% Medications ordered and administered in the Emergency Department:	Medications - No data to displayLabs ordered and resulted in the Emergency Department.	Results for orders placed or performed during the hospital encounter of 06/01/21 TSH w/reflex to FT4 Result Value Ref Range  Thyroid Stimulating Hormone 4.060 (H) 0.358 - 3.740 ?IU/mL Troponin T High Sensitivity, Emergency; 0 hour baseline AND 1 hour with reflex (3 hour) Result Value Ref Range  High Sensitivity Troponin T 11 See Comment ng/L CBC auto differential Result Value Ref Range  WBC 7.8 4.0 - 11.0 x1000/?L  RBC 3.96 (L) 4.00 - 6.00 M/?L  Hemoglobin 12.4 (L) 13.2 - 17.1 g/dL  Hematocrit 47.82 (L) 95.62 - 50.00 %  MCV 93.2 80.0 - 100.0 fL  MCH 31.3 27.0 - 33.0 pg  MCHC 33.6 31.0 - 36.0 g/dL  RDW-CV 13.0 86.5 - 78.4 %  Platelets 259 150 - 420 x1000/?L  MPV 10.6 8.0 - 12.0 fL  Neutrophils 33.3 (L) 39.0 - 72.0 %  Lymphocytes 56.0 (H) 17.0 - 50.0 %  Monocytes 8.7 4.0 - 12.0 %  Eosinophils 1.5 0.0 - 5.0 %  Basophil 0.4 0.0 - 1.4 %  Immature Granulocytes 0.1 0.0 - 1.0 %  nRBC 0.0 0.0 - 1.0 % ANC(Abs Neutrophil Count) 2.61 2.00 - 7.60 x 1000/?L  Absolute Lymphocyte Count 4.39 (H) 0.60 - 3.70 x 1000/?L  Monocyte Absolute Count 0.68 0.00 - 1.00 x 1000/?L  Eosinophil Absolute Count 0.12 0.00 - 1.00 x 1000/?L  Basophil Absolute Count 0.03 0.00 - 1.00 x 1000/?L  Absolute Immature Granulocyte Count 0.01 0.00 - 0.30 x 1000/?L  Absolute nRBC 0.00 0.00 - 1.00 x 1000/?L Comprehensive metabolic panel Result Value Ref Range  Sodium 142 136 - 145 mmol/L  Potassium 3.8 3.5 - 5.1 mmol/L  Chloride 107 95 - 115 mmol/L  CO2 28 21 - 32 mmol/L  Anion Gap 7 5 - 18  Glucose 94 70 - 100 mg/dL  BUN 17 8 - 25 mg/dL  Creatinine 6.96 2.95 - 1.30 mg/dL  Calcium 9.5 8.8 - 28.4 mg/dL  BUN/Creatinine Ratio 13.2 8.0 - 25.0  Total Protein 7.3 6.4 - 8.2 g/dL  Albumin 4.2 3.4 - 5.0 g/dL  Total Bilirubin 1.0 0.0 - 1.0 mg/dL  Alkaline Phosphatase 82 20 - 135 U/L  Alanine Aminotransferase (ALT) 43 12 - 78 U/L  Aspartate Aminotransferase (AST) 27 5 - 37 U/L  Globulin 3.1 g/dL  A/G Ratio 1.4   AST/ALT Ratio 0.6 See Comment  Osmolality Calculation 284 275 - 295 mOsm/kg  eGFR (Creatinine) >60 >=60 mL/min/1.30m2 Troponin T High Sensitivity, 1 Hour With Reflex (BH GH LMW YH) Result Value Ref Range  High Sensitivity Troponin T 10 See Comment ng/L  1 hour Delta from 0 Hour, HS-Troponin T -1 ng/L T4, free Result Value Ref Range  Free T4 1.13 0.76 - 1.46 ng/dL Radiology studies  done in ER: 	CXR (portable) ED Interpretation negative  IV access:	 antecubital right, condition patent, no redness and DDIMental status: AppropriateFunctional Status: IndependentCurrent pain level: 0Dysphagia screen performed: NoIs patient a fall risk: NoIs patient on oxygen: NoSitter ordered/needed? NoRecommendations:		Outstanding tests: No	Consults: Yes	Pending labs/meds/blood products: Yes	Brief plan/summary: admit

## 2021-06-02 NOTE — Utilization Review (ED)
UM Status: Commercial - IP Patient admitted to the SDU for symptomatic bradycardia/near syncope. HR as low as the 40s, TSH 4.060. Plan for further workup.

## 2021-06-03 ENCOUNTER — Encounter: Admit: 2021-06-03 | Payer: PRIVATE HEALTH INSURANCE | Primary: Internal Medicine

## 2021-06-03 NOTE — Progress Notes
[facility] Care Navigation:  Transition of Care Note Care Navigation spoke with: PatientDischarging Hospital[facility]: 5[facility]22/2023Hospital Discharge Date: 06/02/2021    Diagnosis: Symptomatic bradycardiaDischarge location: HomeHOSPITALIZATION:Reason patient admitted: Symptomatic bradycardiaDiagnosis at discharge: Symptomatic bradycardiaCURRENT STATE:Since discharge patient reports feeling: Better Patient cared for by: Spouse  REVIEW OF AFTER VISIT SUMMARY DOCUMENT: MEDICATION CHANGES:Validated NEW medications to take: N/AValidated Changed medications to take: N/AValidated Stopped medications to NOT take: N/AIssues obtaining prescriptions: N/A FOLLOW-UP APPOINTMENTS and TRANSPORTATION:Patient aware of scheduled appointments: Nopt will schedule follow upAwareness and assistance with appointments needing to be scheduled: NoTransportation concerns for follow-up appointment: NoDME and HOME HEALTH SERVICES:Durable medical equipment received: N/AContact has been made with home care agency: N/APlan established for follow up labs/tests: N/A ADDITIONAL PATIENT NEEDS:

## 2021-07-03 ENCOUNTER — Ambulatory Visit: Admit: 2021-07-03 | Payer: PRIVATE HEALTH INSURANCE | Attending: Urology | Primary: Internal Medicine

## 2021-07-10 ENCOUNTER — Telehealth: Admit: 2021-07-10 | Payer: PRIVATE HEALTH INSURANCE | Attending: Urology | Primary: Internal Medicine

## 2021-07-10 NOTE — Telephone Encounter
Patient calling in to see if he can find a sooner date for his Vasectomy appointment. Writer shared that this appointment was canceled due to a change in the provider's schedule and asked if he was aware, writer also shared that there was a voicemail left. Patient shared he wasn't aware and stated he is going to find a new provider as this has been an ongoing matter.

## 2021-07-12 ENCOUNTER — Encounter: Admit: 2021-07-12 | Payer: PRIVATE HEALTH INSURANCE | Primary: Internal Medicine

## 2021-08-07 ENCOUNTER — Ambulatory Visit: Admit: 2021-08-07 | Payer: PRIVATE HEALTH INSURANCE | Attending: Urology | Primary: Internal Medicine

## 2021-08-28 ENCOUNTER — Ambulatory Visit: Admit: 2021-08-28 | Payer: PRIVATE HEALTH INSURANCE | Attending: Urology | Primary: Internal Medicine

## 2021-09-28 ENCOUNTER — Telehealth: Admit: 2021-09-28 | Payer: PRIVATE HEALTH INSURANCE | Attending: Urology | Primary: Internal Medicine

## 2021-09-28 ENCOUNTER — Encounter: Admit: 2021-09-28 | Payer: PRIVATE HEALTH INSURANCE | Attending: Urology | Primary: Internal Medicine

## 2021-09-28 NOTE — Telephone Encounter
Patient called in because he had a reminder call for today's appt.  States that this is an error, when portman left all appts should have been cancelled. appt for Vasectomy

## 2021-10-01 ENCOUNTER — Encounter (HOSPITAL_BASED_OUTPATIENT_CLINIC_OR_DEPARTMENT_OTHER): Payer: Self-pay | Admitting: Emergency Medicine

## 2021-10-01 ENCOUNTER — Emergency Department (HOSPITAL_BASED_OUTPATIENT_CLINIC_OR_DEPARTMENT_OTHER)
Admission: EM | Admit: 2021-10-01 | Discharge: 2021-10-02 | Disposition: A | Discharge status: Home / Self Care | Payer: BC Managed Care – PPO | Attending: Emergency Medicine | Admitting: Emergency Medicine

## 2021-10-01 DIAGNOSIS — R222 Localized swelling, mass and lump, trunk: Secondary | ICD-10-CM | POA: Insufficient documentation

## 2021-10-01 NOTE — ED Triage Notes (Signed)
Noticed a mass on right lower chest this weekend. Denies pain.

## 2021-10-02 NOTE — ED Provider Notes (Signed)
[facility] EMERGENCY DEPARTMENT Provider Note   CSN: [id number] Arrival date & time: 10/01/21  2319     History  Chief Complaint  Patient presents with   Mass    George Huerta is a 36 y.o. male.  The history is provided by the patient.  Illness Location:  Right breast/ chest Quality:  Lesion Severity:  Moderate Onset quality:  Gradual Duration:  1 week Timing:  Constant Progression:  Unchanged Chronicity:  New Context:  Patient feels a lump 5-6 o'clock position Relieved by:  Nothing Worsened by:  Nothing Ineffective treatments:  None Associated symptoms: no fever   Risk factors:  None Patient presents with feeling a lump in the right chest for a week.       Home Medications Prior to Admission medications   Medication Sig Start Date End Date Taking? Authorizing Provider  diclofenac Sodium (VOLTAREN) 1 % GEL Apply 2 g topically 4 (four) times daily as needed. 03/11/20   Long, Arlyss Repress, MD  ibuprofen (ADVIL) 600 MG tablet Take 1 tablet (600 mg total) by mouth every 6 (six) hours as needed. 03/11/20   Long, Arlyss Repress, MD      Allergies    Patient has no known allergies.    Review of Systems   Review of Systems  Constitutional:  Negative for fever.  HENT:  Negative for facial swelling.   Musculoskeletal:  Negative for gait problem.  All other systems reviewed and are negative.   Physical Exam Updated Vital Signs BP 129/81 (BP Location: Left Arm)   Pulse 79   Temp 98.3 F (36.8 C) (Oral)   Resp 16   Ht 6\' 1"  (1.854 m)   Wt 95.3 kg   SpO2 98%   BMI 27.71 kg/m  Physical Exam Vitals and nursing note reviewed.  Constitutional:      General: He is not in acute distress.    Appearance: He is well-developed. He is not diaphoretic.  HENT:     Head: Normocephalic and atraumatic.  Eyes:     Conjunctiva/sclera: Conjunctivae normal.     Pupils: Pupils are equal, round, and reactive to light.  Cardiovascular:     Rate and Rhythm: Normal rate and  regular rhythm.     Pulses: Normal pulses.     Heart sounds: Normal heart sounds.  Pulmonary:     Effort: Pulmonary effort is normal.     Breath sounds: Normal breath sounds. No wheezing or rales.  Chest:    Abdominal:     General: Bowel sounds are normal.     Palpations: Abdomen is soft.     Tenderness: There is no abdominal tenderness. There is no guarding or rebound.  Musculoskeletal:        General: Normal range of motion.     Cervical back: Normal range of motion and neck supple.  Skin:    General: Skin is warm and dry.  Neurological:     Mental Status: He is alert and oriented to person, place, and time.     ED Results / Procedures / Treatments   Labs (all labs ordered are listed, but only abnormal results are displayed) Labs Reviewed - No data to display  EKG None  Radiology No results found.  Procedures Procedures    Medications Ordered in ED Medications - No data to display  ED Course/ Medical Decision Making/ A&P  Medical Decision Making Patient presents with right chest lesion palpable x 1 week   Amount and/or Complexity of Data Reviewed External Data Reviewed: notes.    Details: Previous notes reviewed   Risk Risk Details: This is in the area of the male breast.  There is no gynecomastia but this needs beast/ chest wall imaging.  I will refer to the breast center for imaging of the area. Patient is instructed verbally and in writing to call for appointment at the breast center and to follow up with his family doctor for ongoing care.      Final Clinical Impression(s) / ED Diagnoses Final diagnoses:  Chest wall mass   Return for intractable cough, coughing up blood, fevers > 100.4 unrelieved by medication, shortness of breath, intractable vomiting, chest pain, shortness of breath, weakness, numbness, changes in speech, facial asymmetry, abdominal pain, passing out, Inability to tolerate liquids or food, cough, altered  mental status or any concerns. No signs of systemic illness or infection. The patient is nontoxic-appearing on exam and vital signs are within normal limits.  I have reviewed the triage vital signs and the nursing notes. Pertinent labs & imaging results that were available during my care of the patient were reviewed by me and considered in my medical decision making (see chart for details). After history, exam, and medical workup I feel the patient has been appropriately medically screened and is safe for discharge home. Pertinent diagnoses were discussed with the patient. Patient was given return precautions.  Rx / DC Orders ED Discharge Orders     None         Karis Emig, MD 10/02/21 0045

## 2022-02-16 DIAGNOSIS — R41840 Attention and concentration deficit: Secondary | ICD-10-CM | POA: Insufficient documentation

## 2022-05-27 ENCOUNTER — Other Ambulatory Visit: Payer: Self-pay

## 2022-05-27 ENCOUNTER — Emergency Department (HOSPITAL_BASED_OUTPATIENT_CLINIC_OR_DEPARTMENT_OTHER)
Admission: EM | Admit: 2022-05-27 | Discharge: 2022-05-27 | Discharge status: Against Medical Advice | Payer: BC Managed Care – PPO | Attending: Emergency Medicine | Admitting: Emergency Medicine

## 2022-05-27 DIAGNOSIS — Z5321 Procedure and treatment not carried out due to patient leaving prior to being seen by health care provider: Secondary | ICD-10-CM | POA: Diagnosis not present

## 2022-05-27 DIAGNOSIS — R42 Dizziness and giddiness: Secondary | ICD-10-CM | POA: Insufficient documentation

## 2022-05-27 LAB — CBC
HCT: 45.3 % (ref 39.0–52.0)
Hemoglobin: 15.6 g/dL (ref 13.0–17.0)
MCH: 32.3 pg (ref 26.0–34.0)
MCHC: 34.4 g/dL (ref 30.0–36.0)
MCV: 93.8 fL (ref 80.0–100.0)
Platelets: 327 10*3/uL (ref 150–400)
RBC: 4.83 MIL/uL (ref 4.22–5.81)
RDW: 14.6 % (ref 11.5–15.5)
WBC: 8.3 10*3/uL (ref 4.0–10.5)
nRBC: 0 % (ref 0.0–0.2)

## 2022-05-27 LAB — BASIC METABOLIC PANEL
Anion gap: 8 (ref 5–15)
BUN: 9 mg/dL (ref 6–20)
CO2: 27 mmol/L (ref 22–32)
Calcium: 9.4 mg/dL (ref 8.9–10.3)
Chloride: 103 mmol/L (ref 98–111)
Creatinine, Ser: 1.16 mg/dL (ref 0.61–1.24)
GFR, Estimated: >60 mL/min (ref >60)
Glucose, Bld: 94 mg/dL (ref 70–99)
Potassium: 3.8 mmol/L (ref 3.5–5.1)
Sodium: 138 mmol/L (ref 135–145)

## 2022-05-27 LAB — CBG MONITORING, ED: Glucose-Capillary: 107 mg/dL — ABNORMAL HIGH (ref 70–99)

## 2022-05-27 NOTE — ED Triage Notes (Signed)
C/O dizziness, explained as feeling like he is going to passed out, denies PMH.

## 2022-05-28 ENCOUNTER — Emergency Department (HOSPITAL_BASED_OUTPATIENT_CLINIC_OR_DEPARTMENT_OTHER)
Admission: EM | Admit: 2022-05-28 | Discharge: 2022-05-28 | Discharge status: Against Medical Advice | Payer: BC Managed Care – PPO

## 2022-05-28 ENCOUNTER — Other Ambulatory Visit: Payer: Self-pay

## 2022-09-20 ENCOUNTER — Encounter: Payer: Self-pay | Admitting: Family Medicine

## 2022-09-20 ENCOUNTER — Ambulatory Visit (INDEPENDENT_AMBULATORY_CARE_PROVIDER_SITE_OTHER): Payer: Self-pay | Admitting: Family Medicine

## 2022-09-20 VITALS — BP 124/80 | HR 80 | Ht 74.0 in | Wt 196.0 lb

## 2022-09-20 DIAGNOSIS — M7712 Lateral epicondylitis, left elbow: Secondary | ICD-10-CM | POA: Diagnosis not present

## 2022-09-20 MED ORDER — MELOXICAM 15 MG PO TABS: 15.0000 mg | ORAL_TABLET | Freq: Every day | ORAL | 0 refills | Status: DC
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
## 2022-09-20 NOTE — Progress Notes (Signed)
     Primary Care / Sports Medicine Office Visit  Patient Information:  Patient ID: George Huerta, male DOB: 1985-09-05 Age: 37 y.o. MRN: [id number]   George Huerta is a pleasant 37 y.o. male presenting with the following:  Chief Complaint  Patient presents with   Arm Pain    Left forearm for 3 months, lifting weights     Vitals:   09/20/22 0801  BP: 124/80  Pulse: 80  SpO2: 99%   Vitals:   09/20/22 0801  Weight: 196 lb (88.9 kg)  Height: 6\' 2"  (1.88 m)   Body mass index is 25.16 kg/m.  No results found.   Independent interpretation of notes and tests performed by another provider:   None  Procedures performed:   None  Pertinent History, Exam, Impression, and Recommendations:   George Huerta was seen today for arm pain.  Left lateral epicondylitis Assessment & Plan: RHD patient presenting with 3-4 months atraumatic left anterolateral elbow pain, does recall possible ramp-up in upper body athletics in gym at that time. No radiation, no swelling or ecchymosis. He did trial resting 1-2 weeks with recurrence of pain after returning to gym.  Exam localizes to lateral epicondyle with positive provocative testing for lateral epicondylitis, no weakness.  We reviewed treatments, plan as follows: - Start meloxicam daily with food - Use counter-force brace during wakeful hours - Hold from upper body athletics - Start home exercises in 1 week - Return in 4 weeks - Suboptimal progress to be addressed with consideration of CSI  Orders: -     Meloxicam; Take 1 tablet (15 mg total) by mouth daily.  Dispense: 30 tablet; Refill: 0     Orders & Medications Meds ordered this encounter  Medications   meloxicam (MOBIC) 15 MG tablet    Sig: Take 1 tablet (15 mg total) by mouth daily.    Dispense:  30 tablet    Refill:  0   No orders of the defined types were placed in this encounter.    No follow-ups on file.     Jerrol Banana, MD, [facility]   Primary Care Sports  Medicine Primary Care and Sports Medicine at [facility]

## 2022-09-20 NOTE — Patient Instructions (Signed)
-   Start meloxicam daily with food - Use counter-force brace during wakeful hours - Hold from upper body athletics - Start home exercises in 1 week - Return in 4 weeks

## 2022-10-17 ENCOUNTER — Other Ambulatory Visit: Payer: Self-pay | Admitting: Family Medicine

## 2022-10-17 DIAGNOSIS — M7712 Lateral epicondylitis, left elbow: Secondary | ICD-10-CM

## 2022-10-18 ENCOUNTER — Ambulatory Visit: Payer: BC Managed Care – PPO | Admitting: Family Medicine

## 2022-10-18 NOTE — Telephone Encounter (Signed)
Requested medications are due for refill today.  unsure  Requested medications are on the active medications list.  yes  Last refill. 09/20/2022 #30 0 rf  Future visit scheduled.   no  Notes to clinic.  No pcp listed.     Requested Prescriptions  Pending Prescriptions Disp Refills   meloxicam (MOBIC) 15 MG tablet [Pharmacy Med Name: MELOXICAM 15MG  TABLETS] 30 tablet 0    Sig: TAKE 1 TABLET(15 MG) BY MOUTH DAILY     Analgesics:  COX2 Inhibitors Failed - 10/17/2022  3:10 AM      Failed - Manual Review: Labs are only required if the patient has taken medication for more than 8 weeks.      Failed - AST in normal range and within 360 days    AST  Date Value Ref Range Status  03/11/2020 32 15 - 41 U/L Final         Failed - ALT in normal range and within 360 days    ALT  Date Value Ref Range Status  03/11/2020 42 0 - 44 U/L Final         Failed - Valid encounter within last 12 months    Recent Outpatient Visits           4 weeks ago Left lateral epicondylitis   [facility] Ocie Bob, MD              Passed - HGB in normal range and within 360 days    Hemoglobin  Date Value Ref Range Status  05/27/2022 15.6 13.0 - 17.0 g/dL Final         Passed - Cr in normal range and within 360 days    Creatinine, Ser  Date Value Ref Range Status  05/27/2022 1.16 0.61 - 1.24 mg/dL Final         Passed - HCT in normal range and within 360 days    HCT  Date Value Ref Range Status  05/27/2022 45.3 39.0 - 52.0 % Final         Passed - eGFR is 30 or above and within 360 days    GFR, Estimated  Date Value Ref Range Status  05/27/2022 >60 >60 mL/min Final    Comment:    (NOTE) Calculated using the CKD-EPI Creatinine Equation (2021)          Passed - Patient is not pregnant

## 2023-03-04 IMAGING — CR DG CHEST 2V
2 series · 2 of 2 positions shown · non-contrast
Comparison: March 11, 2020

CLINICAL DATA: Chest pain

EXAM:
CHEST - 2 VIEW

[w chest pa]
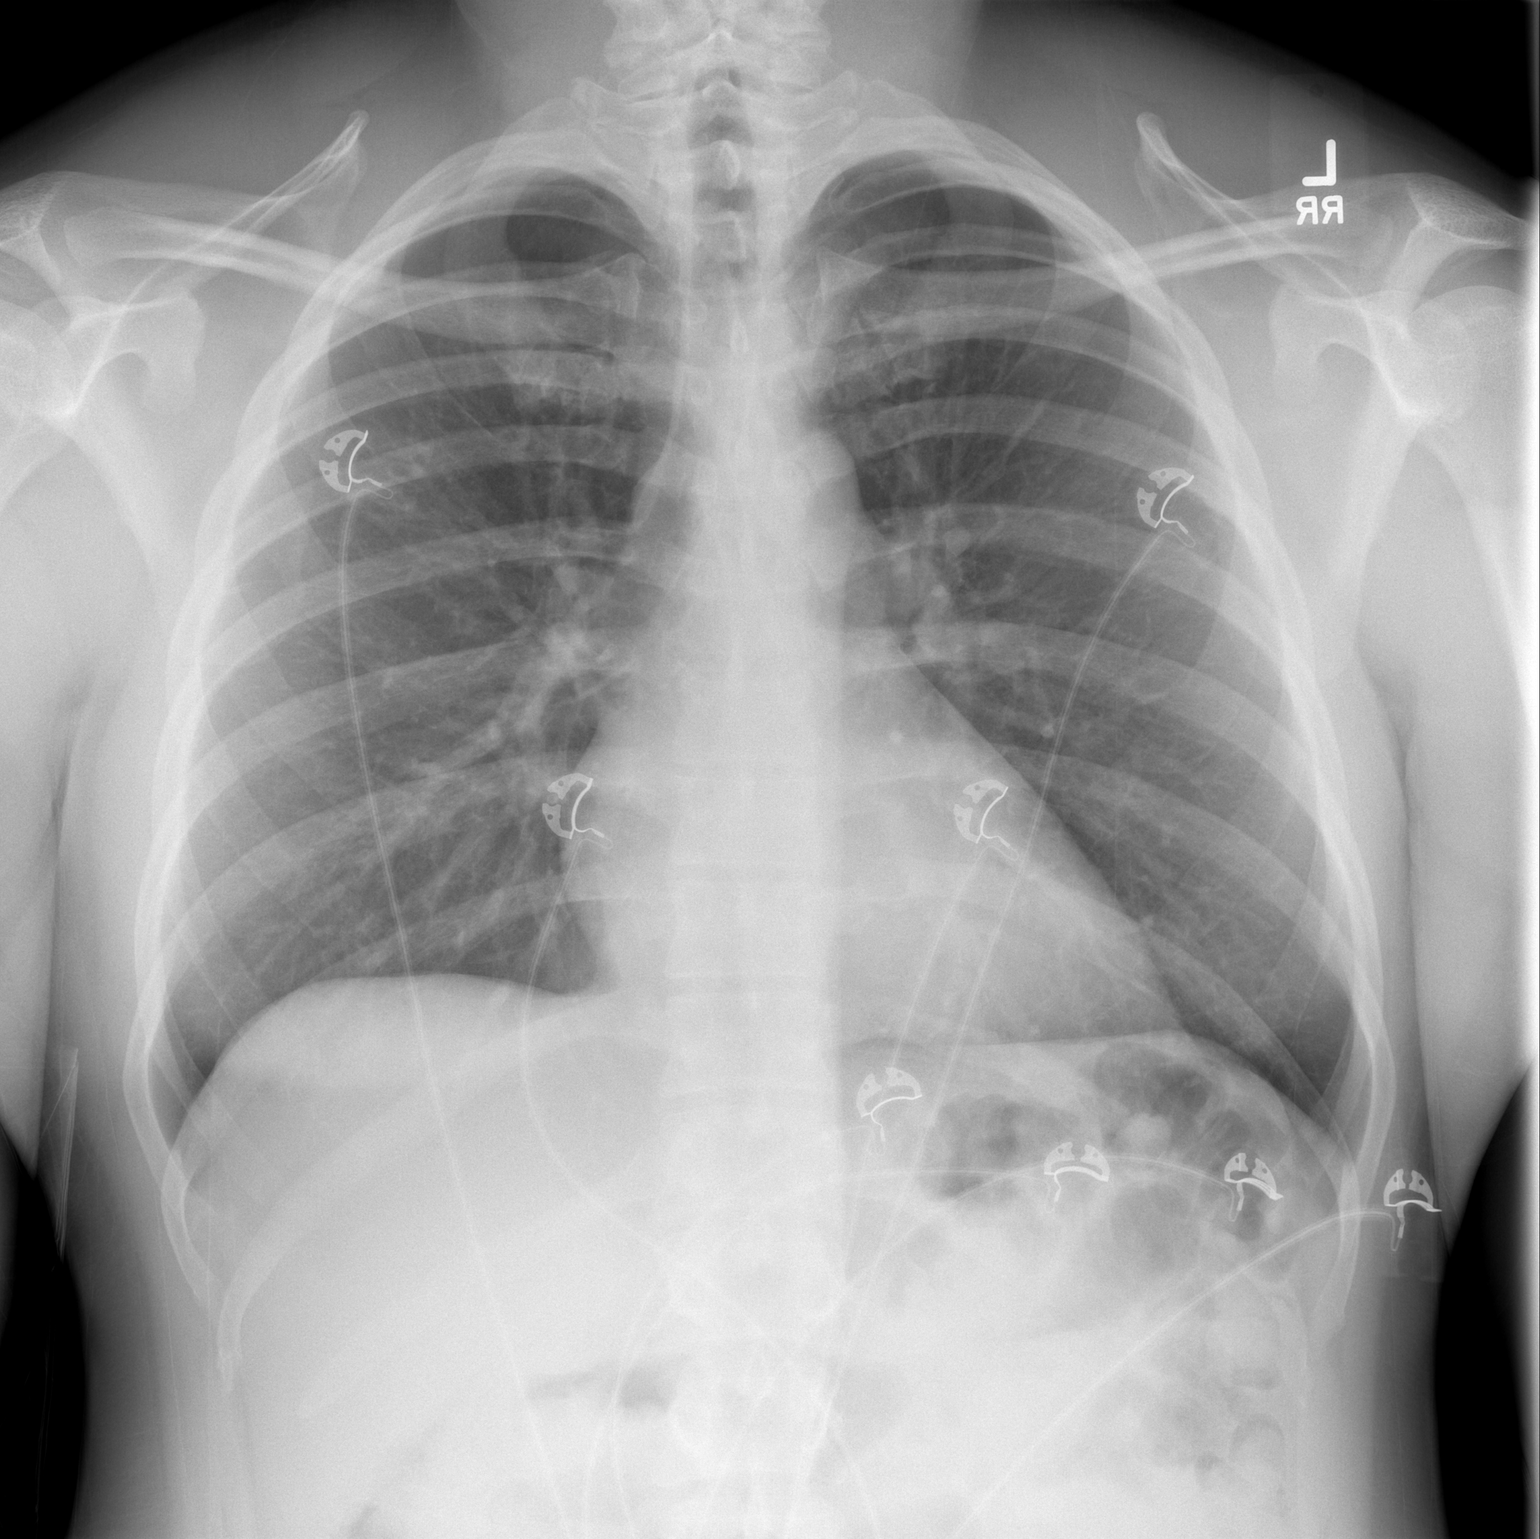

[w chest lat]
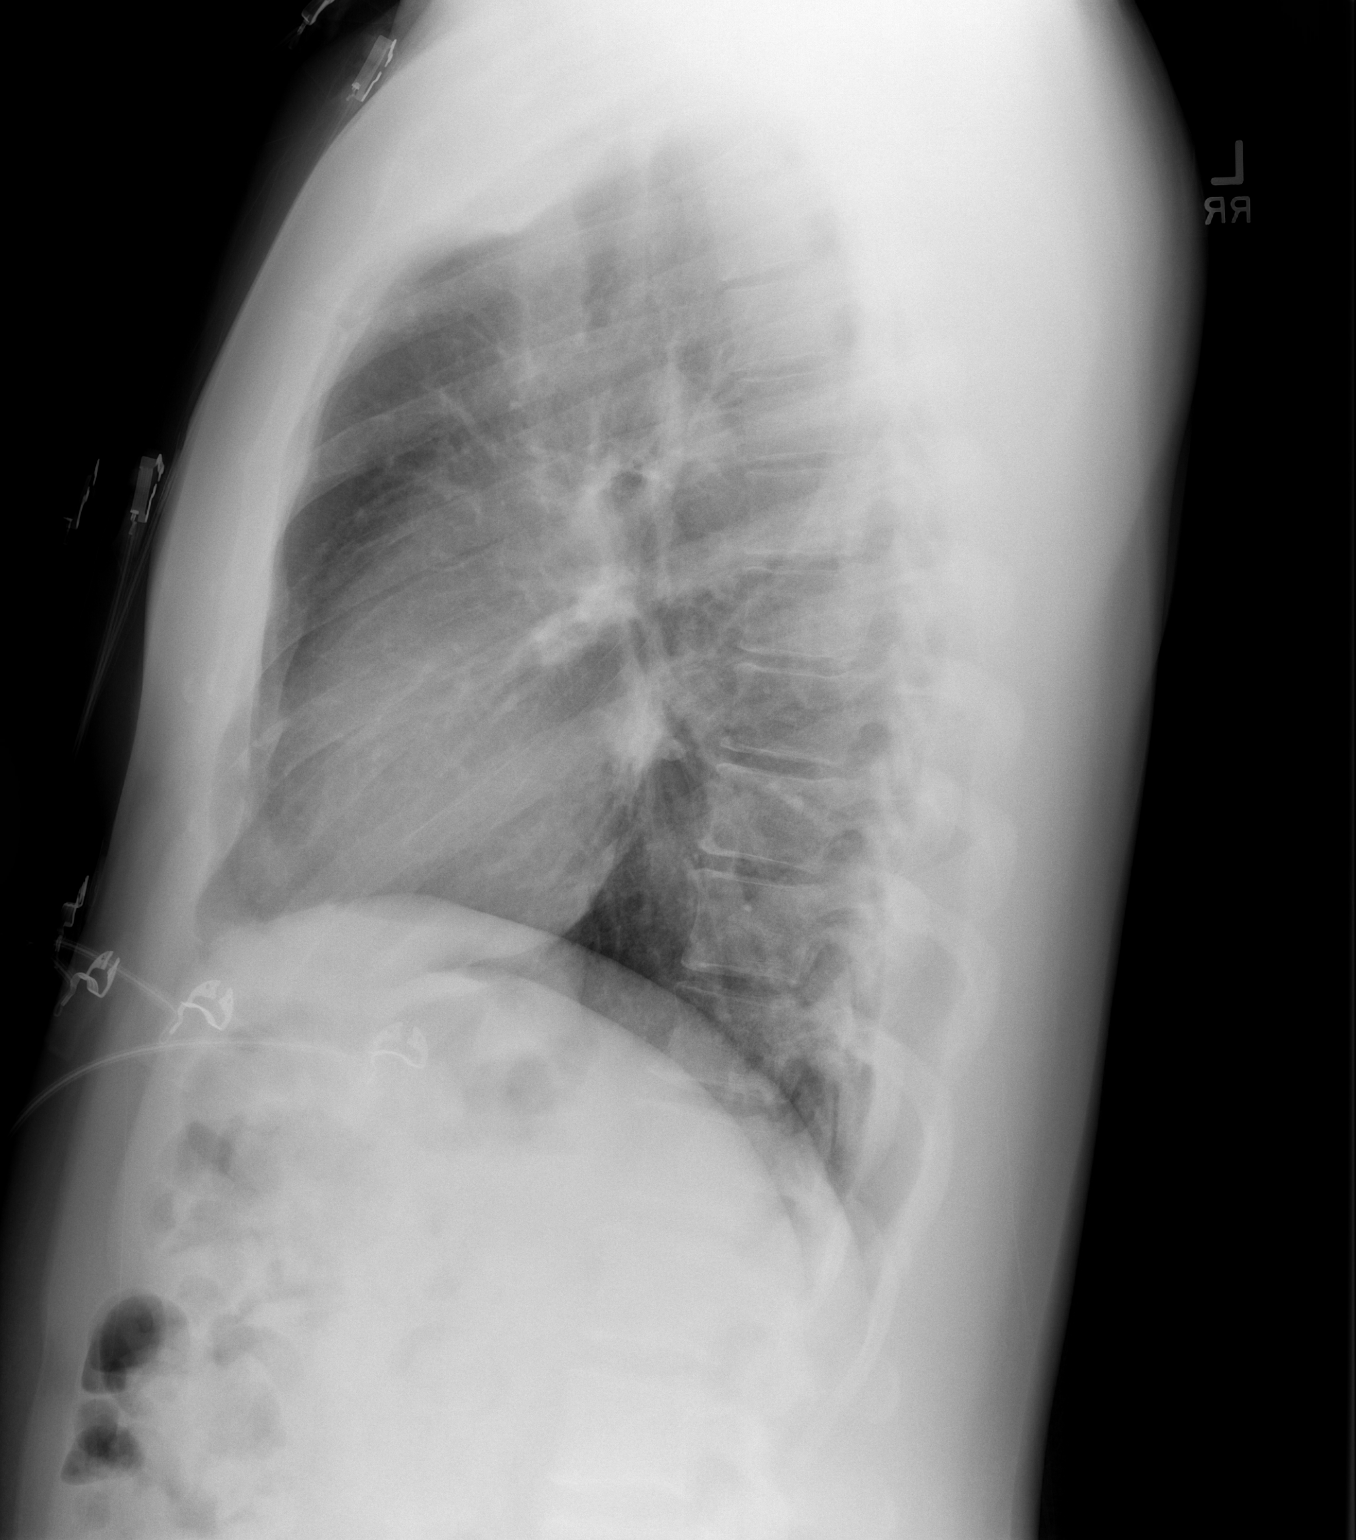

[2 of 2 positions shown; findings below may reference images not displayed]

FINDINGS: The heart size and mediastinal contours are within normal limits.
Both lungs are clear. The visualized skeletal structures are
unremarkable.
IMPRESSION: No active cardiopulmonary disease.

## 2023-05-23 ENCOUNTER — Other Ambulatory Visit: Payer: Self-pay

## 2023-05-23 ENCOUNTER — Emergency Department (HOSPITAL_BASED_OUTPATIENT_CLINIC_OR_DEPARTMENT_OTHER)
Admission: EM | Admit: 2023-05-23 | Discharge: 2023-05-23 | Disposition: A | Discharge status: Home / Self Care | Attending: Emergency Medicine | Admitting: Emergency Medicine

## 2023-05-23 ENCOUNTER — Emergency Department (HOSPITAL_BASED_OUTPATIENT_CLINIC_OR_DEPARTMENT_OTHER)

## 2023-05-23 ENCOUNTER — Encounter (HOSPITAL_BASED_OUTPATIENT_CLINIC_OR_DEPARTMENT_OTHER): Payer: Self-pay | Admitting: Emergency Medicine

## 2023-05-23 ENCOUNTER — Emergency Department (HOSPITAL_BASED_OUTPATIENT_CLINIC_OR_DEPARTMENT_OTHER): Admitting: Radiology

## 2023-05-23 DIAGNOSIS — R519 Headache, unspecified: Secondary | ICD-10-CM | POA: Insufficient documentation

## 2023-05-23 DIAGNOSIS — M545 Low back pain, unspecified: Secondary | ICD-10-CM

## 2023-05-23 DIAGNOSIS — Y9239 Other specified sports and athletic area as the place of occurrence of the external cause: Secondary | ICD-10-CM | POA: Diagnosis not present

## 2023-05-23 DIAGNOSIS — M546 Pain in thoracic spine: Secondary | ICD-10-CM | POA: Insufficient documentation

## 2023-05-23 DIAGNOSIS — M542 Cervicalgia: Secondary | ICD-10-CM | POA: Diagnosis present

## 2023-05-23 DIAGNOSIS — M533 Sacrococcygeal disorders, not elsewhere classified: Secondary | ICD-10-CM | POA: Diagnosis not present

## 2023-05-23 MED ORDER — CYCLOBENZAPRINE HCL 10 MG PO TABS: 10.0000 mg | ORAL_TABLET | Freq: Two times a day (BID) | ORAL | 0 refills | Status: DC | PRN

## 2023-05-23 MED ORDER — IBUPROFEN 800 MG PO TABS: 800.0000 mg | ORAL_TABLET | Freq: Three times a day (TID) | ORAL | 0 refills | Status: DC | PRN
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
## 2023-05-23 NOTE — ED Notes (Signed)
 Reviewed AVS/discharge instruction with patient. Time allotted for and all questions answered. Patient is agreeable for d/c and escorted to ed exit by staff.
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
## 2024-01-24 ENCOUNTER — Emergency Department (HOSPITAL_BASED_OUTPATIENT_CLINIC_OR_DEPARTMENT_OTHER): Admitting: Radiology

## 2024-01-24 ENCOUNTER — Emergency Department (HOSPITAL_BASED_OUTPATIENT_CLINIC_OR_DEPARTMENT_OTHER)
Admission: EM | Admit: 2024-01-24 | Discharge: 2024-01-24 | Disposition: A | Discharge status: Home / Self Care | Attending: Emergency Medicine | Admitting: Emergency Medicine

## 2024-01-24 ENCOUNTER — Other Ambulatory Visit: Payer: Self-pay

## 2024-01-24 DIAGNOSIS — K219 Gastro-esophageal reflux disease without esophagitis: Secondary | ICD-10-CM | POA: Diagnosis not present

## 2024-01-24 DIAGNOSIS — R066 Hiccough: Secondary | ICD-10-CM | POA: Diagnosis present

## 2024-01-24 DIAGNOSIS — D72829 Elevated white blood cell count, unspecified: Secondary | ICD-10-CM | POA: Insufficient documentation

## 2024-01-24 LAB — CBC
HCT: 44.3 % (ref 39.0–52.0)
Hemoglobin: 15.5 g/dL (ref 13.0–17.0)
MCH: 33 pg (ref 26.0–34.0)
MCHC: 35 g/dL (ref 30.0–36.0)
MCV: 94.3 fL (ref 80.0–100.0)
Platelets: 315 K/uL (ref 150–400)
RBC: 4.7 MIL/uL (ref 4.22–5.81)
RDW: 15 % (ref 11.5–15.5)
WBC: 14.3 K/uL — ABNORMAL HIGH (ref 4.0–10.5)
nRBC: 0 % (ref 0.0–0.2)

## 2024-01-24 LAB — BASIC METABOLIC PANEL WITH GFR
Anion gap: 11 (ref 5–15)
BUN: 14 mg/dL (ref 6–20)
CO2: 27 mmol/L (ref 22–32)
Calcium: 10.1 mg/dL (ref 8.9–10.3)
Chloride: 98 mmol/L (ref 98–111)
Creatinine, Ser: 1.36 mg/dL — ABNORMAL HIGH (ref 0.61–1.24)
GFR, Estimated: >60 mL/min
Glucose, Bld: 166 mg/dL — ABNORMAL HIGH (ref 70–99)
Potassium: 3.9 mmol/L (ref 3.5–5.1)
Sodium: 136 mmol/L (ref 135–145)

## 2024-01-24 LAB — TROPONIN T, HIGH SENSITIVITY: Troponin T High Sensitivity: <15 ng/L (ref 0–19)

## 2024-01-24 MED ORDER — PANTOPRAZOLE SODIUM 20 MG PO TBEC: 20.0000 mg | DELAYED_RELEASE_TABLET | Freq: Every day | ORAL | 1 refills | Status: AC

## 2024-01-24 MED ORDER — BACLOFEN 10 MG PO TABS: 10.0000 mg | ORAL_TABLET | Freq: Three times a day (TID) | ORAL | 0 refills | Status: DC

## 2024-01-24 NOTE — ED Triage Notes (Signed)
 Reports hiccups x 48 hrs. Causing burning sensation in epigastric area.

## 2024-01-24 NOTE — ED Provider Notes (Signed)
" °  [place] EMERGENCY DEPARTMENT AT [facility] Provider Note   CSN: [id number] Arrival date & time: 01/24/24  1548     Patient presents with: Hiccups   George Huerta is a 39 y.o. male.  {Add pertinent medical, surgical, social history, OB history to HPI:32947} HPI     Prior to Admission medications  Medication Sig Start Date End Date Taking? Authorizing Provider  cyclobenzaprine  (FLEXERIL ) 10 MG tablet Take 1 tablet (10 mg total) by mouth 2 (two) times daily as needed for up to 20 doses for muscle spasms. 05/23/23   Cottie Donnice PARAS, MD  ibuprofen  (ADVIL ) 800 MG tablet Take 1 tablet (800 mg total) by mouth every 8 (eight) hours as needed for up to 30 doses. 05/23/23   Cottie Donnice PARAS, MD  meloxicam  (MOBIC ) 15 MG tablet Take 1 tablet (15 mg total) by mouth daily. 09/20/22   Matthews, Jason J, MD    Allergies: Patient has no known allergies.    Review of Systems  Updated Vital Signs BP 133/79 (BP Location: Right Arm)   Pulse 97   Temp 98.1 F (36.7 C) (Oral)   Resp 18   SpO2 99%   Physical Exam  (all labs ordered are listed, but only abnormal results are displayed) Labs Reviewed  BASIC METABOLIC PANEL WITH GFR - Abnormal; Notable for the following components:      Result Value   Glucose, Bld 166 (*)    Creatinine, Ser 1.36 (*)    All other components within normal limits  CBC - Abnormal; Notable for the following components:   WBC 14.3 (*)    All other components within normal limits  TROPONIN T, HIGH SENSITIVITY  TROPONIN T, HIGH SENSITIVITY    EKG: EKG Interpretation Date/Time:  Tuesday January 24 2024 15:58:18 EST Ventricular Rate:  97 PR Interval:  155 QRS Duration:  91 QT Interval:  329 QTC Calculation: 418 R Axis:   86  Text Interpretation: Sinus rhythm Borderline T abnormalities, inferior leads Minimal ST elevation, lateral leads Confirmed by Jerrol Agent ([id number]) on 01/24/2024 5:25:24 PM  Radiology: DG Chest 2 View Result Date:  01/24/2024 CLINICAL DATA:  Chest pain EXAM: CHEST - 2 VIEW COMPARISON:  05/23/2023 FINDINGS: The heart size and mediastinal contours are within normal limits. Both lungs are clear. The visualized skeletal structures are unremarkable. IMPRESSION: No active cardiopulmonary disease. Electronically Signed   By: Luke Bun M.D.   On: 01/24/2024 16:51    {Document cardiac monitor, telemetry assessment procedure when appropriate:32947} Procedures   Medications Ordered in the ED - No data to display    {Click here for ABCD2, HEART and other calculators REFRESH Note before signing:1}                              Medical Decision Making Amount and/or Complexity of Data Reviewed Labs: ordered. Radiology: ordered.   ***  {Document critical care time when appropriate  Document review of labs and clinical decision tools ie CHADS2VASC2, etc  Document your independent review of radiology images and any outside records  Document your discussion with family members, caretakers and with consultants  Document social determinants of health affecting pt's care  Document your decision making why or why not admission, treatments were needed:32947:::1}   Final diagnoses:  None    ED Discharge Orders     None        "

## 2024-01-25 ENCOUNTER — Other Ambulatory Visit: Payer: Self-pay

## 2024-01-25 ENCOUNTER — Emergency Department (HOSPITAL_BASED_OUTPATIENT_CLINIC_OR_DEPARTMENT_OTHER)
Admission: EM | Admit: 2024-01-25 | Discharge: 2024-01-25 | Disposition: A | Discharge status: Home / Self Care | Attending: Emergency Medicine | Admitting: Emergency Medicine

## 2024-01-25 ENCOUNTER — Encounter (HOSPITAL_BASED_OUTPATIENT_CLINIC_OR_DEPARTMENT_OTHER): Payer: Self-pay

## 2024-01-25 ENCOUNTER — Telehealth: Payer: Self-pay | Admitting: Gastroenterology

## 2024-01-25 DIAGNOSIS — R110 Nausea: Secondary | ICD-10-CM | POA: Insufficient documentation

## 2024-01-25 DIAGNOSIS — R066 Hiccough: Secondary | ICD-10-CM | POA: Diagnosis present

## 2024-01-25 MED ORDER — METOCLOPRAMIDE HCL 10 MG PO TABS
10.0000 mg | ORAL_TABLET | Freq: Once | ORAL | Status: AC
  Administered 2024-01-25: 10 mg via ORAL
  Filled 2024-01-25: qty 1

## 2024-01-25 NOTE — ED Triage Notes (Signed)
 Seen yesterday C/O hiccups continuous Having difficulty with eating.  States his PCP sent him back for a scan.

## 2024-01-25 NOTE — Telephone Encounter (Signed)
 Patient currently in the ER @ 1604.

## 2024-01-25 NOTE — Telephone Encounter (Signed)
 Copied from CRM #[id number]. Topic: Clinical Concerns - Emergent Call >> Jan 25, 2024  1:08 PM Suzen Cohens Barrett wrote: George Huerta, George Huerta is calling for clinical concerns (Ask: If symptom currently happening or happened earlier today? Must Review Emergent List for symptoms. Document Name of Triage Nurse/BH Rep taking the call when applicable)   Include all details related to the request(s) below: Patient calling in, was advised to contact triage due to hiccups he has had for days. Patient stated he feels like he is about to pass out. Patient is not alone, does have some co workers with him.   PACT representative can observe patient having hiccups over the phone which is causing patient to sound as if he is not breathing well.   Transferred to triage at clinic.    Confirm and type the Best Contact Number below:  Patient/caller contact number:  [phone]           [] Home  [x] Mobile  [] Work [] Other   [x] Okay to leave a voicemail   Medication List:  Current Outpatient Medications:    atoMOXetine (STRATTERA) 25 mg capsule, Take 1 capsule (25 mg total) by mouth daily. Swallow capsule whole; do not open. If opened accidentally, do not touch eyes; wash hands immediately (product is an eye irritant)., Disp: 90 capsule, Rfl: 3   valACYclovir  (VALTREX ) 500 mg tablet, Take 1 tablet (500 mg total) by mouth daily Indications: herpes simplex infection., Disp: 90 tablet, Rfl: 3     Medication Request/Refills: Pharmacy Information (if applicable)   [x] Not Applicable       []  Pharmacy listed  Send Medication Request to:                                                 [] Pharmacy not listed (added to pharmacy list in Epic) Send Medication Request to:      Listed Pharmacies: [id number] - [place], [place] - [address] AT [address] - PHONE: [phone] - FAX: [phone]

## 2024-01-25 NOTE — ED Triage Notes (Signed)
 States has chest discomfort when hiccupping up food.  States does not want blood work drawn.

## 2024-01-25 NOTE — ED Provider Notes (Signed)
 " [place] EMERGENCY DEPARTMENT AT [facility] Provider Note   CSN: [id number] Arrival date & time: 01/25/24  1559     Patient presents with: Hiccups   George Huerta is a 39 y.o. male.  Who presents to the ED for intractable hiccups.  Patient was seen yesterday in the ED for intractable hiccups that began 3 days ago.  He was also reporting some GERD like symptoms at that time and was discharged on Protonix  for acid reflux along with baclofen  to help with the hiccups.  These medications have been ineffective.  He is still having hiccups through the day.  He reportedly even had hiccups during his sleep last night.  Does feel need to burp and feels as though this is similar to very bad acid reflux.  Some nausea but no vomiting.  He tried to induce vomiting himself but this did not help.  He is able to swallow liquids but is apprehensive about trying to take in solids feeling this may   HPI     Prior to Admission medications  Medication Sig Start Date End Date Taking? Authorizing Provider  baclofen  (LIORESAL ) 10 MG tablet Take 1 tablet (10 mg total) by mouth 3 (three) times daily. 01/24/24   Myriam Dorn BROCKS, PA  cyclobenzaprine  (FLEXERIL ) 10 MG tablet Take 1 tablet (10 mg total) by mouth 2 (two) times daily as needed for up to 20 doses for muscle spasms. 05/23/23   Cottie Donnice PARAS, MD  ibuprofen  (ADVIL ) 800 MG tablet Take 1 tablet (800 mg total) by mouth every 8 (eight) hours as needed for up to 30 doses. 05/23/23   Cottie Donnice PARAS, MD  meloxicam  (MOBIC ) 15 MG tablet Take 1 tablet (15 mg total) by mouth daily. 09/20/22   Matthews, Jason J, MD  pantoprazole  (PROTONIX ) 20 MG tablet Take 1 tablet (20 mg total) by mouth daily. 01/24/24   Myriam Dorn BROCKS, PA    Allergies: Patient has no known allergies.    Review of Systems  Updated Vital Signs BP 134/81 (BP Location: Right Arm)   Pulse 93   Temp 98.6 F (37 C) (Oral)   Resp 16   Ht 6' 2 (1.88 m)   Wt 88.9 kg   SpO2  100%   BMI 25.16 kg/m   Physical Exam Vitals and nursing note reviewed.  HENT:     Head: Normocephalic and atraumatic.     Mouth/Throat:     Pharynx: Oropharynx is clear. No oropharyngeal exudate or posterior oropharyngeal erythema.  Eyes:     Pupils: Pupils are equal, round, and reactive to light.  Cardiovascular:     Rate and Rhythm: Normal rate and regular rhythm.  Pulmonary:     Effort: Pulmonary effort is normal.     Breath sounds: Normal breath sounds.  Abdominal:     Palpations: Abdomen is soft.     Tenderness: There is no abdominal tenderness.  Skin:    General: Skin is warm and dry.  Neurological:     Mental Status: He is alert.  Psychiatric:        Mood and Affect: Mood normal.     (all labs ordered are listed, but only abnormal results are displayed) Labs Reviewed - No data to display  EKG: EKG Interpretation Date/Time:  Wednesday January 25 2024 16:09:54 EST Ventricular Rate:  87 PR Interval:  149 QRS Duration:  91 QT Interval:  343 QTC Calculation: 413 R Axis:   83  Text Interpretation: Sinus rhythm Ventricular  premature complex Consider right atrial enlargement Confirmed by Pamella Sharper [phone]) on 01/25/2024 5:41:51 PM  Radiology: [facility] Chest 2 View Result Date: 01/24/2024 CLINICAL DATA:  Chest pain EXAM: CHEST - 2 VIEW COMPARISON:  05/23/2023 FINDINGS: The heart size and mediastinal contours are within normal limits. Both lungs are clear. The visualized skeletal structures are unremarkable. IMPRESSION: No active cardiopulmonary disease. Electronically Signed   By: Luke Bun M.D.   On: 01/24/2024 16:51     Procedures   Medications Ordered in the ED  metoCLOPramide  (REGLAN ) tablet 10 mg (10 mg Oral Given 01/25/24 1747)    Clinical Course as of 01/25/24 1956  Wed Jan 25, 2024  1954 Patient has remained hiccup free for the last 40 to 50 minutes after dose of Reglan .  I also tried something called a singultus repair which involves pressure between  the 2 heads of the SCM just posterior to the clavicle and has been shown to relieve hiccups.  He is requesting discharge home now.  He will continue PPI and follow-up with GI as previously intended [MP]    Clinical Course User Index [MP] Pamella Sharper LABOR, DO                                 Medical Decision Making 39 year old male with history as above presenting for intractable hiccups over the last 3 to 4 days.  Was seen here yesterday baclofen  and PPI have not helped much.  Still having sensation of acid reflux belching and ongoing hiccups.  Cardiac workup is negative.  We will trial Reglan  here as this has been shown to help with hiccups that may be related to GERD and continue to monitor  Risk Prescription drug management.        Final diagnoses:  Intractable hiccups    ED Discharge Orders     None          Pamella Sharper LABOR, DO 01/25/24 1956  "

## 2024-01-25 NOTE — Telephone Encounter (Signed)
 Incoming call from pt regarding hospital follow up. Pt stated he is experiencing nausea, vomiting, and excess belching. Pt requesting sooner appointment per hospitals recommendation. Pt scheduled 02/10 @8 :40. Please advise. Thank you.

## 2024-01-25 NOTE — Telephone Encounter (Signed)
 Patient transferred from PACT, states medication he received from the ED has not helped and is hiccupping throughout the night. States feels like the food is not going all the way down and is stuck in his chest. States feeling SOB due to the hiccups. States has had hiccups since Monday. Patient reports hiccups are severe and are worse. States has never had this happen in the past. States also having bad heartburn. States vomits after he eats due to the hiccups.   Denies CP, abd pain.  Spoke with provider advised patient to go back to the ED r/t SOB form the hiccups and worsening of symptoms. Patient agreed and verbalized understanding.

## 2024-01-25 NOTE — ED Notes (Signed)
 Pt reports still having hiccups, spoke with MD. We will wait a little longer for PO reglan  to take effect and reassess.

## 2024-01-25 NOTE — Telephone Encounter (Signed)
 Darold Hummer to [facility] (supporting Meghan Garwin Roys, PA-C) [place]    01/25/24 11:02 AM Im still having hiccups and a lot of burping like I want to gag or throw up. Darold Hummer to [facility] (supporting Meghan Garwin Roys, [place]) [place]    01/25/24 10:59 AM I went to Hospital yesterday evening and the end prescribing me some medication for acid reflux & heartburn. They also prescribed some medication that should help with Hiccups. I woke up this morning and I still have them. Im having hiccups even as I sleep. This is so annoying. They told me to set an appointment with a Stomach Dr but I wanted to do a follow up with you before I set an appointment. I prefer you guys to refer me than someone else if you feel its best I go. [place].

## 2024-01-25 NOTE — ED Notes (Signed)
 Patient states he feels better, no hiccups in .

## 2024-01-25 NOTE — Discharge Instructions (Signed)
 You were seen in the emergency department again for hiccups Your hiccups seem to resolve after medication called Reglan  and the manipulation on your neck Follow-up with your GI specialist continue taking Protonix  Take the baclofen  only if the hiccups happen again Return for chest pain or trouble breathing

## 2024-01-26 NOTE — Telephone Encounter (Signed)
 Spoke with patient today. Appointment was scheduled for 2/10 however patient is requesting a sooner appointment with being seen in the ER multiple times recently. Appointment scheduled for tomorrow, 01/27/24 @ 3 PM.

## 2024-01-27 ENCOUNTER — Encounter: Payer: Self-pay | Admitting: Nurse Practitioner

## 2024-01-27 ENCOUNTER — Ambulatory Visit: Admitting: Nurse Practitioner

## 2024-01-27 VITALS — BP 120/70 | HR 84 | Ht 74.0 in | Wt 215.0 lb

## 2024-01-27 DIAGNOSIS — K219 Gastro-esophageal reflux disease without esophagitis: Secondary | ICD-10-CM

## 2024-01-27 DIAGNOSIS — F17210 Nicotine dependence, cigarettes, uncomplicated: Secondary | ICD-10-CM | POA: Diagnosis not present

## 2024-01-27 DIAGNOSIS — R066 Hiccough: Secondary | ICD-10-CM

## 2024-01-27 NOTE — Patient Instructions (Addendum)
 Continue Pantoprazole  20 mg once daily.   Reduce Alcohol intake.   You have been scheduled for an endoscopy. Please follow written instructions given to you at your visit today.  If you use inhalers (even only as needed), please bring them with you on the day of your procedure.  If you take any of the following medications, they will need to be adjusted prior to your procedure:   DO NOT TAKE 7 DAYS PRIOR TO TEST- Trulicity (dulaglutide) Ozempic, Wegovy (semaglutide) Mounjaro, Zepbound (tirzepatide) Bydureon Bcise (exanatide extended release)  DO NOT TAKE 1 DAY PRIOR TO YOUR TEST Rybelsus (semaglutide) Adlyxin (lixisenatide) Victoza (liraglutide) Byetta (exanatide)    GERD in Adults: Diet Changes When you have gastroesophageal reflux disease (GERD), you may need to make changes to your diet. Choosing the right foods can help with your symptoms. Think about working with an expert in healthy eating called a dietitian. They can help you make healthy food choices. What are tips for following this plan? Reading food labels Look for foods that are low in saturated fat. Foods that may help with your symptoms include: Foods with less than 5% of daily value (DV) of fat. Foods with 0 grams of trans fat. Cooking [employer] in ways that don't use a lot of fat. These ways include: Baking. Steaming. Grilling. Broiling. To add flavor, try to use herbs that are low in spice and acidity. Avoid frying your food. Meal planning  Eat small meals often rather than eating 3 large meals each day. Eat your meals slowly in a place where you feel relaxed. If told by your health care provider, avoid: Foods that cause symptoms. Keep a food diary to keep track of foods that cause symptoms. Alcohol. Drinking a lot of liquid with meals. General instructions For 2-3 hours after you eat, avoid: Bending over. Exercise. Lying down. Chew sugar-free gum after meals. What foods should I eat? Eat a  healthy diet. Try to include: Foods with high amounts of fiber. These include: Fruits and vegetables. Whole grains and beans. Low-fat dairy products. Lean meats, fish, and poultry. Egg whites. Foods that cause symptoms in someone else may not cause symptoms for you. Work with your provider to find foods that are safe for you. The items listed above may not be all the foods and drinks you can have. Talk with a dietitian to learn more. The items listed above may not be a complete list of foods and beverages you can eat and drink. Contact a dietitian for more information. What foods should I avoid? Limiting some of these foods may help with your symptoms. Each person is different. Talk with a dietitian or your provider to help you find the exact foods to avoid. Some of the foods to avoid may include: Fruits Fruits with a lot of acid in them. These may include citrus fruits, such as oranges, grapefruit, pineapple, and lemons. Vegetables Deep-fried vegetables, such as French fries. Vegetables, sauces, or toppings made with added fat and vegetables with acid in them. These may include tomatoes and tomato products, chili peppers, onions, garlic, and horseradish. Grains Pastries or quick breads with added fat. Meats and other proteins High-fat meats, such as fatty beef or pork, hot dogs, ribs, ham, sausage, salami, and bacon. Fried meat or protein, such as fried fish and fried chicken. Egg yolks. Fats and oils Butter. Margarine. Shortening. Ghee. Drinks Coffee and other drinks with caffeine in them. Fizzy and sugary drinks, such as soda and energy drinks. Fruit  juice made with acidic fruits, such as orange or grapefruit. Tomato juice. Sweets and desserts Chocolate and cocoa. Donuts. Seasonings and condiments Mint, such as peppermint and spearmint. Condiments, herbs, or seasonings that cause symptoms. These may include curry, hot sauce, or vinegar-based salad dressings. The items listed above  may not be all the foods and drinks you should avoid. Talk with a dietitian to learn more. Questions to ask your health care provider Changes to your diet and everyday life are often the first steps taken to manage symptoms of GERD. If these changes don't help, talk with your provider about taking medicines. Where to find more information International Foundation for Gastrointestinal Disorders: aboutgerd.org This information is not intended to replace advice given to you by your health care provider. Make sure you discuss any questions you have with your health care provider. Document Revised: 11/09/2022 Document Reviewed: 05/26/2022 Elsevier Patient Education  2024 Elsevier Inc.   Steps to Quit Smoking Smoking tobacco is the leading cause of preventable death. It can affect almost every organ in the body. Smoking puts you and those around you at risk for developing many serious chronic diseases. Quitting smoking can be very challenging. Do not get discouraged if you are not successful the first time. Some people need to make many attempts to quit before they achieve long-term success. Do your best to stick to your quit plan, and talk with your health care provider if you have any questions or concerns. How do I get ready to quit? When you decide to quit smoking, create a plan to help you succeed. Before you quit: Pick a date to quit. Set a date within the next 2 weeks to give you time to prepare. Write down the reasons why you are quitting. Keep this list in places where you will see it often. Tell your family, friends, and co-workers that you are quitting. Support from people you are close to can make quitting easier. Talk with your health care provider about your options for quitting smoking. Find out what treatment options are covered by your health insurance. Identify people, places, things, and activities that make you want to smoke (triggers). Avoid them. What first steps can I take to  quit smoking? Throw away all cigarettes at home, at work, and in your car. Throw away smoking accessories, such as set designer. Clean your car. Make sure to empty the ashtray. Clean your home, including curtains and carpets. What strategies can I use to quit smoking? Talk with your health care provider about combining strategies, such as taking medicines while you are also receiving in-person counseling. Using these two strategies together makes you more likely to succeed in quitting than if you used either strategy on its own. If you are pregnant or breastfeeding, talk with your health care provider about finding counseling or other support strategies to quit smoking. Do not take medicine to help you quit smoking unless your health care provider tells you to. Quit right away Quit smoking completely, instead of gradually reducing how much you smoke over a period of time. Stopping smoking right away may be more successful than gradually quitting. Attend in-person counseling to help you build problem-solving skills. You are more likely to succeed in quitting if you attend counseling sessions regularly. Even short sessions of 10 minutes can be effective. Take medicine You may take medicines to help you quit smoking. Some medicines require a prescription. You can also purchase over-the-counter medicines. Medicines may have nicotine in them to replace  the nicotine in cigarettes. Medicines may: Help to stop cravings. Help to relieve withdrawal symptoms. Your health care provider may recommend: Nicotine patches, gum, or lozenges. Nicotine inhalers or sprays. Non-nicotine medicine that you take by mouth. Find resources Find resources and support systems that can help you quit smoking and remain smoke-free after you quit. These resources are most helpful when you use them often. They include: Online chats with a veterinary surgeon. Telephone quitlines. Printed materials engineer. Support groups or  group counseling. Text messaging programs. Mobile phone apps or applications. Use apps that can help you stick to your quit plan by providing reminders, tips, and encouragement. Examples of free services include Quit Guide from the CDC and smokefree.gov  What can I do to make it easier to quit?  Reach out to your family and friends for support and encouragement. Call telephone quitlines, such as 1-800-QUIT-NOW, reach out to support groups, or work with a counselor for support. Ask people who smoke to avoid smoking around you. Avoid places that trigger you to smoke, such as bars, parties, or smoke-break areas at work. Spend time with people who do not smoke. Lessen the stress in your life. Stress can be a smoking trigger for some people. To lessen stress, try: Exercising regularly. Doing deep-breathing exercises. Doing yoga. Meditating. What benefits will I see if I quit smoking? Over time, you should start to see positive results, such as: Improved sense of smell and taste. Decreased coughing and sore throat. Slower heart rate. Lower blood pressure. Clearer and healthier skin. The ability to breathe more easily. Fewer sick days. Summary Quitting smoking can be very challenging. Do not get discouraged if you are not successful the first time. Some people need to make many attempts to quit before they achieve long-term success. When you decide to quit smoking, create a plan to help you succeed. Quit smoking right away, not slowly over a period of time. Find resources and support systems that can help you quit smoking and remain smoke-free after you quit. This information is not intended to replace advice given to you by your health care provider. Make sure you discuss any questions you have with your health care provider. Document Revised: 12/19/2020 Document Reviewed: 12/19/2020 Elsevier Patient Education  2024 Elsevier Inc.    Thank you for trusting me with your gastrointestinal  care!   Elida Shawl, CRNP
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
## 2024-02-01 NOTE — Progress Notes (Signed)
 Agree with the assessment and plan as outlined by Alcide Evener, NP.    Zae Kirtz E. Tomasa Rand, MD [facility]

## 2024-02-02 ENCOUNTER — Encounter: Payer: Self-pay | Admitting: Gastroenterology

## 2024-02-07 ENCOUNTER — Encounter: Admitting: Gastroenterology

## 2024-02-14 ENCOUNTER — Encounter

## 2024-02-20 ENCOUNTER — Encounter

## 2024-02-21 ENCOUNTER — Ambulatory Visit: Admitting: Gastroenterology

## 2024-03-05 ENCOUNTER — Encounter: Admitting: Gastroenterology
